# Patient Record
Sex: Male | Born: 1945 | Race: White | Hispanic: No | Marital: Married | Smoking: Never smoker
Health system: Southern US, Community
[De-identification: ages and names within clinical notes are randomized; demographics above are authoritative.]

## PROBLEM LIST (undated history)

## (undated) DIAGNOSIS — F32A Depression, unspecified: Secondary | ICD-10-CM

## (undated) DIAGNOSIS — K760 Fatty (change of) liver, not elsewhere classified: Secondary | ICD-10-CM

## (undated) DIAGNOSIS — K219 Gastro-esophageal reflux disease without esophagitis: Secondary | ICD-10-CM

## (undated) DIAGNOSIS — I700 Atherosclerosis of aorta: Secondary | ICD-10-CM

## (undated) DIAGNOSIS — G4730 Sleep apnea, unspecified: Secondary | ICD-10-CM

## (undated) DIAGNOSIS — N400 Enlarged prostate without lower urinary tract symptoms: Secondary | ICD-10-CM

## (undated) DIAGNOSIS — M1990 Unspecified osteoarthritis, unspecified site: Secondary | ICD-10-CM

## (undated) DIAGNOSIS — E039 Hypothyroidism, unspecified: Secondary | ICD-10-CM

## (undated) DIAGNOSIS — Z8739 Personal history of other diseases of the musculoskeletal system and connective tissue: Secondary | ICD-10-CM

## (undated) DIAGNOSIS — B029 Zoster without complications: Secondary | ICD-10-CM

## (undated) DIAGNOSIS — I10 Essential (primary) hypertension: Secondary | ICD-10-CM

## (undated) DIAGNOSIS — H9190 Unspecified hearing loss, unspecified ear: Secondary | ICD-10-CM

## (undated) DIAGNOSIS — J45909 Unspecified asthma, uncomplicated: Secondary | ICD-10-CM

## (undated) HISTORY — PX: PALATE SURGERY: SHX729

## (undated) HISTORY — DX: Unspecified hearing loss, unspecified ear: H91.90

## (undated) HISTORY — DX: Benign prostatic hyperplasia without lower urinary tract symptoms: N40.0

## (undated) HISTORY — PX: NECK SURGERY: SHX720

## (undated) HISTORY — DX: Zoster without complications: B02.9

## (undated) HISTORY — PX: BACK SURGERY: SHX140

---

## 1998-05-28 ENCOUNTER — Ambulatory Visit (HOSPITAL_COMMUNITY): Admission: RE | Admit: 1998-05-28 | Discharge: 1998-05-28 | Payer: Self-pay | Admitting: *Deleted

## 2006-03-04 ENCOUNTER — Ambulatory Visit: Payer: Self-pay | Admitting: Otolaryngology

## 2006-03-14 ENCOUNTER — Ambulatory Visit: Payer: Self-pay | Admitting: Gastroenterology

## 2007-06-02 ENCOUNTER — Inpatient Hospital Stay (HOSPITAL_COMMUNITY): Admission: RE | Admit: 2007-06-02 | Discharge: 2007-06-04 | Payer: Self-pay | Admitting: Neurosurgery

## 2008-04-16 ENCOUNTER — Ambulatory Visit: Payer: Self-pay | Admitting: Gastroenterology

## 2011-01-12 NOTE — H&P (Signed)
NAMETAYON, PAREKH NO.:  [id number]   MEDICAL RECORD NO.:  [id number]          PATIENT TYPE:  OIB   LOCATION:  3037                         FACILITY:  [facility]   PHYSICIAN:  Payton Doughty, M.D.      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   SERVICE:  Neurosurgery   This is a very nice 65 year old right-handed white gentleman has had two  lumbar fusions done in [facility], has not gotten any better.  He came  to my office with questions about a spinal cord stimulator.  On exam he  was somewhat myelopathic so I obtained an MRI of the cervical spine,  found he has got cervical spondylosis with cord compression at 5-6 and 6-  7, and he is admitted now for an anterior decompression and fusion at  those levels.   MEDICAL HISTORY:  He takes Zoloft, Synthroid, a blood pressure medicine  and Neurontin.  He has no allergies.   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   SOCIAL HISTORY:  He does not smoke and drink and retired a bit early  because of back and leg pain.   FAMILY HISTORY:  Both parents are deceased.  Mother had back surgery and  respiratory difficulties and sleep apnea.   REVIEW OF SYSTEMS:  Remarkable for glasses, tinnitus, hypertension, leg  pain, shortness of breath, back pain, neck pain and thyroid disease.   HEENT EXAM:  Within normal limits.  He has some neck pain but good range  of motion.  CHEST:  Clear.  CARDIAC EXAM:  Regular rate and rhythm.  ABDOMEN:  Nontender with no hepatosplenomegaly.  EXTREMITIES:  Without clubbing or cyanosis.  GU EXAM:  Deferred.  PERIPHERAL PULSES:  Good.  NEUROLOGICAL:  He is awake, alert and oriented.  His cranial nerves are  intact.  He has a mild Lhermitte's sign.  Motor exam shows 5/5  strength  throughout the upper and lower extremities.  He does have pain and  limited weakness in the dorsiflexors on the right.  Sensory dysesthesias  described in the right L5 distribution.  Reflexes are 3 at the biceps, 3  at the triceps, 1 at the brachioradialis.  Hoffman's is mildly positive  on the left.  Lower extremity knee jerks are 2.  Ankle jerk is absent on  the right, left is 1.  He has a slightly positive straight leg raise on  the right.   MR shows cervical spondylosis with bilateral foraminal narrowing.  There  is mild cord compression but no abnormal signal.   CLINICAL IMPRESSION:  Cervical spondylosis with early myelopathy.   The plan is for an anterior decompression and fusion at C5-6, C6-7.  The  risks and benefits have been discussed with him and he wishes to  proceed.           ______________________________  Emilie Rutter.  Channing Mutters, M.D.     MWR/MEDQ  D:  06/02/2007  T:  06/03/2007  Job:  [id number]

## 2011-01-12 NOTE — Op Note (Signed)
Bradley Holland, Bradley Holland               ACCOUNT NO.:  [id number]   MEDICAL RECORD NO.:  [id number]          PATIENT TYPE:  AMB   LOCATION:  SDS                          FACILITY:  [facility]   PHYSICIAN:  Payton Doughty, M.D.      DATE OF BIRTH:  1946/03/01   DATE OF PROCEDURE:  06/02/2007  DATE OF DISCHARGE:                               OPERATIVE REPORT   PREOPERATIVE DIAGNOSIS:  Spondylosis C5-C6 and C6-C7.   POSTOPERATIVE DIAGNOSIS:  Spondylosis C5-C6 and C6-C7.   OPERATIVE PROCEDURE:  C5-C6 and C6-C7 anterior cervical decompression  and fusion with Reflex hybrid plate.   SURGEON:  Payton Doughty, M.D.   ASSISTANT:  Coletta Memos, M.D.  [place]   ANESTHESIA:  General endotracheal anesthesia.   PREPARATION:  Betadine prep with alcohol wipe.   COMPLICATIONS:  None.   BODY OF TEXT:  This 64 year old gentleman with cervical spondylitic  myelopathy is taken to the operating room, smoothly anesthetized, and  intubated, placed supine on the operating table in the halter head  traction.  Following shave, prep, and drape in the usual sterile  fashion, the skin was incised in the midline of the medial border of the  sternocleidomastoid muscle two fingerbreadths below the level of the  carotid tubercle.  The sternocleidomastoid was identified, medial  dissection revealed the carotid artery retracted laterally to the left,  the trachea and esophagus retracted laterally to right, exposing the  bones of the anterior cervical spine.  A marker was placed.  Intraoperative x-ray obtained to confirm correctness of the level.  Having confirmed correctness of the level, the longus colli was taken  down bilaterally.  The Shadowline self-retaining retractor was placed.  Under gross observation, discectomy was carried out at C5-C6 and C6-C7.  The operating microscope was then brought in.  We used microdissection  technique to remove the remaining posterior osteophytes, dissect the  neural foramen, and  divide the posterior longitudinal ligament.  There  was significant spondylitic disease at both levels, a larger midline bar  at C5-C6, and more foraminal disease at C6-C7.  Following complete  decompression, 7 mm bone grafts were fashioned with patellar allograft  and tapped into place.  A 40 mm Reflex hybrid plate was placed with 12  mm screws, two in C5, two in C6, and two in C7.  Intraoperative x-ray  confirmed good placement of bone graft, plate and screws.  The  wound was irrigated.  Hemostasis was assured.  Successive layers of 3-0  Vicryl and 4-0 Vicryl were used to close.  Benzoin and Steri-Strips were  placed made occlusive with Telfa and OpSite.  The patient was placed in  an Aspen collar and returned to the recovery room in good condition.           ______________________________  Payton Doughty, M.D.     MWR/MEDQ  D:  06/02/2007  T:  06/03/2007  Job:  [id number]

## 2011-06-10 LAB — URINALYSIS, ROUTINE W REFLEX MICROSCOPIC
Bilirubin Urine: NEGATIVE
Ketones, ur: NEGATIVE
Nitrite: NEGATIVE
Specific Gravity, Urine: 1.03
Urobilinogen, UA: 0.2
pH: 5.5

## 2011-06-10 LAB — COMPREHENSIVE METABOLIC PANEL
Albumin: 4.2
BUN: 17
Creatinine, Ser: 1.09
Glucose, Bld: 108 — ABNORMAL HIGH
Total Protein: 6.7

## 2011-06-10 LAB — CBC
HCT: 44.5
Hemoglobin: 15.6
MCHC: 35
MCV: 88.5
Platelets: 234
RBC: 5.03
RDW: 13.4
WBC: 5

## 2011-06-10 LAB — DIFFERENTIAL
Basophils Absolute: 0
Lymphocytes Relative: 30
Monocytes Absolute: 0.4
Monocytes Relative: 8
Neutro Abs: 3
Neutrophils Relative %: 60

## 2011-06-10 LAB — PROTIME-INR: INR: 0.9

## 2011-06-10 LAB — TYPE AND SCREEN

## 2011-06-10 LAB — APTT: aPTT: 26

## 2011-08-31 DIAGNOSIS — B029 Zoster without complications: Secondary | ICD-10-CM

## 2011-08-31 HISTORY — DX: Zoster without complications: B02.9

## 2011-09-07 ENCOUNTER — Ambulatory Visit: Payer: Self-pay | Admitting: Family Medicine

## 2011-09-14 ENCOUNTER — Ambulatory Visit: Payer: Self-pay | Admitting: Gastroenterology

## 2011-09-14 HISTORY — PX: COLONOSCOPY: SHX174

## 2011-09-21 ENCOUNTER — Ambulatory Visit: Payer: Self-pay | Admitting: Family Medicine

## 2013-07-17 ENCOUNTER — Ambulatory Visit: Payer: Self-pay | Admitting: Otolaryngology

## 2013-07-18 ENCOUNTER — Ambulatory Visit: Payer: Self-pay | Admitting: Otolaryngology

## 2014-02-12 ENCOUNTER — Encounter: Payer: Self-pay | Admitting: General Surgery

## 2014-02-12 ENCOUNTER — Ambulatory Visit (INDEPENDENT_AMBULATORY_CARE_PROVIDER_SITE_OTHER): Payer: Medicare PPO | Admitting: General Surgery

## 2014-02-12 VITALS — BP 114/64 | HR 70 | Temp 98.1°F | Resp 14 | Ht 68.0 in | Wt 195.0 lb

## 2014-02-12 DIAGNOSIS — R229 Localized swelling, mass and lump, unspecified: Secondary | ICD-10-CM

## 2014-02-12 DIAGNOSIS — B0229 Other postherpetic nervous system involvement: Secondary | ICD-10-CM

## 2014-02-12 DIAGNOSIS — R222 Localized swelling, mass and lump, trunk: Secondary | ICD-10-CM

## 2014-02-12 NOTE — Patient Instructions (Addendum)
The patient is aware to call back for any questions or concerns. Call back after one week with status report.

## 2014-02-12 NOTE — Progress Notes (Signed)
Patient ID: Bradley Holland, male   DOB: 04/12/1946, 68 y.o.   MRN: [id number]  Chief Complaint  Patient presents with  . Other    right back mass    HPI Bradley Holland is a 68 y.o. male here today for an evaluation mass on right back . Patient states the mass has been there since Jan 2013 after he had the shingles. It seems to be a constant irritation. Lifting makes it worse. Trying cymbalta, fentanyl patch and gabapentin. Followed by Dr. Manuella Ghazi as well for neuropathy.  The area is point tender with direct pressure, as when he would sit in a hardback chair.     HPI  Past Medical History  Diagnosis Date  . Hearing loss   . Prostate enlargement   . Shingles Jan 2013    No past surgical history on file.  No family history on file.  Social History History  Substance Use Topics  . Smoking status: Never Smoker   . Smokeless tobacco: Never Used  . Alcohol Use: No    Allergies  Allergen Reactions  . Ambien [Zolpidem Tartrate] Other (See Comments)    "crazy"  . Sulfa Antibiotics Rash    Current Outpatient Prescriptions  Medication Sig Dispense Refill  . aspirin 81 MG tablet Take 81 mg by mouth daily.      . B Complex Vitamins (B-COMPLEX/B-12 PO) Take 1 tablet by mouth daily.      . cyanocobalamin (,VITAMIN B-12,) 1000 MCG/ML injection Inject into the muscle.      . DULoxetine (CYMBALTA) 60 MG capsule Take 60 mg by mouth daily.      . fentaNYL (DURAGESIC - DOSED MCG/HR) 25 MCG/HR patch Place onto the skin.      Marland Kitchen gabapentin (NEURONTIN) 300 MG capsule Take 600 mg by mouth 2 (two) times daily.       Marland Kitchen levothyroxine (SYNTHROID, LEVOTHROID) 100 MCG tablet Take 100 mcg by mouth daily before breakfast.      . lovastatin (MEVACOR) 40 MG tablet Take 40 mg by mouth at bedtime.      . sertraline (ZOLOFT) 100 MG tablet Take 100 mg by mouth daily.      . traMADol (ULTRAM) 50 MG tablet Take 50 mg by mouth every 6 (six) hours as needed.      . triamterene-hydrochlorothiazide (MAXZIDE-25)  37.5-25 MG per tablet Take 1 tablet by mouth daily.       No current facility-administered medications for this visit.    Review of Systems Review of Systems  Constitutional: Positive for chills.  Respiratory: Positive for shortness of breath. Negative for apnea, cough, choking, chest tightness, wheezing and stridor.   Cardiovascular: Negative.     Blood pressure 114/64, pulse 70, temperature 98.1 F (36.7 C), temperature source Oral, resp. rate 14, height 5\' 8"  (1.727 m), weight 195 lb (88.451 kg).  Physical Exam Physical Exam  Constitutional: He is oriented to person, place, and time. He appears well-developed and well-nourished.  Neck: Neck supple.  Cardiovascular: Normal rate, regular rhythm and normal heart sounds.   Pulmonary/Chest: Effort normal and breath sounds normal.  Musculoskeletal: Normal range of motion.  Lymphadenopathy:    He has no cervical adenopathy.  Neurological: He is alert and oriented to person, place, and time.  Skin: Skin is warm and dry.  2.5 cm soft tissue nodule 6 cm from midline at T8 location    Data Reviewed PCP notes of 02/08/2014  Assessment    Right back lipoma.  Postherpetic  neuralgia.    Plan    It's possible that the lipoma is compressing a cutaneous nerve, aggravating his postherpetic neuralgia. Prior to excision I recommended a cortisone shot. 4 mg of dexamethasone mixed with 2 cc of 0.5% Xylocaine with 0.25% Marcaine with 1 200,000 units epinephrine was instilled. He was well tolerated. Post injection there was a significant decrease in trigger point discomfort.  We'll see his response to this over the next week and consider excision if he does not show persistent improvement in his pain.    Call back after one week with status up date.  PCP: Ocie Cornfield 02/13/2014, 1:15 PM

## 2014-02-13 DIAGNOSIS — B0229 Other postherpetic nervous system involvement: Secondary | ICD-10-CM | POA: Insufficient documentation

## 2014-02-13 DIAGNOSIS — R222 Localized swelling, mass and lump, trunk: Secondary | ICD-10-CM | POA: Insufficient documentation

## 2014-02-18 ENCOUNTER — Telehealth: Payer: Self-pay | Admitting: *Deleted

## 2014-02-18 NOTE — Telephone Encounter (Signed)
Pt called and was giving an update on how he was doing with the Cortisone shot that he had due to having a mass on his back and he thinks that it is not helping, that he is still in some pain with it.

## 2014-02-19 NOTE — Telephone Encounter (Signed)
Arrange f/u appt for excision of lipoma from the right back.

## 2014-03-06 ENCOUNTER — Encounter: Payer: Self-pay | Admitting: General Surgery

## 2014-03-06 ENCOUNTER — Ambulatory Visit (INDEPENDENT_AMBULATORY_CARE_PROVIDER_SITE_OTHER): Payer: Medicare PPO | Admitting: General Surgery

## 2014-03-06 VITALS — BP 128/76 | HR 71 | Resp 13 | Ht 68.0 in | Wt 190.0 lb

## 2014-03-06 DIAGNOSIS — R222 Localized swelling, mass and lump, trunk: Secondary | ICD-10-CM

## 2014-03-06 DIAGNOSIS — D235 Other benign neoplasm of skin of trunk: Secondary | ICD-10-CM

## 2014-03-06 NOTE — Progress Notes (Signed)
Patient ID: Bradley Holland, male   DOB: 04/19/1946, 68 y.o.   MRN: [id number]  Chief Complaint  Patient presents with  . Follow-up    lipoma on back    HPI Bradley Holland is a 68 y.o. male  Here following for excision of a nodular area on his back. He reports that the Cortisone injection he was given at his last visit resulted in transient improvement, but one of the local anesthetic wore off the pain was back to baseline. It is still very sensitive to touch and has not changed in size.  The area of point tenderness was identified by the patient and confirmed to show focal thickening in the area. HPI  Past Medical History  Diagnosis Date  . Hearing loss   . Prostate enlargement   . Shingles Jan 2013    No past surgical history on file.  No family history on file.  Social History History  Substance Use Topics  . Smoking status: Never Smoker   . Smokeless tobacco: Never Used  . Alcohol Use: No    Allergies  Allergen Reactions  . Ambien [Zolpidem Tartrate] Other (See Comments)    "crazy"  . Sulfa Antibiotics Rash    Current Outpatient Prescriptions  Medication Sig Dispense Refill  . aspirin 81 MG tablet Take 81 mg by mouth daily.      . B Complex Vitamins (B-COMPLEX/B-12 PO) Take 1 tablet by mouth daily.      . cyanocobalamin (,VITAMIN B-12,) 1000 MCG/ML injection Inject into the muscle.      . DULoxetine (CYMBALTA) 60 MG capsule Take 60 mg by mouth daily.      . fentaNYL (DURAGESIC - DOSED MCG/HR) 25 MCG/HR patch Place onto the skin.      Marland Kitchen gabapentin (NEURONTIN) 300 MG capsule Take 600 mg by mouth 2 (two) times daily.       Marland Kitchen levothyroxine (SYNTHROID, LEVOTHROID) 100 MCG tablet Take 100 mcg by mouth daily before breakfast.      . lovastatin (MEVACOR) 40 MG tablet Take 40 mg by mouth at bedtime.      . sertraline (ZOLOFT) 100 MG tablet Take 100 mg by mouth daily.      . traMADol (ULTRAM) 50 MG tablet Take 50 mg by mouth every 6 (six) hours as needed.      .  triamterene-hydrochlorothiazide (MAXZIDE-25) 37.5-25 MG per tablet Take 1 tablet by mouth daily.       No current facility-administered medications for this visit.    Review of Systems Review of Systems  Constitutional: Negative.   Respiratory: Negative.   Cardiovascular: Negative.     Blood pressure 128/76, pulse 71, resp. rate 13, height 5\' 8"  (1.727 m), weight 190 lb (86.183 kg).  Physical Exam Physical Exam  Constitutional: He is oriented to person, place, and time. He appears well-developed and well-nourished.  Pulmonary/Chest:    Neurological: He is alert and oriented to person, place, and time.       Assessment    Focal tenderness along the paraspinal soft tissue, transiently improved with local anesthesia injection.     Plan    We reviewed the potential for excisional biopsy in the hopes that this would improve his pain profile. It was emphasized that I could not guarantee that his pain would not persist after the mass was excised. He desired to proceed. The area was marked by the patient and focal point tenderness with nodularity confirmed on clinical exam. 10 cc of 0.5% Xylocaine  with 0.25% Marcaine with 1-200,000 of epinephrine was utilized well tolerated. Chlor prep was applied to the skin. The rate transverse incision the skin and subcutaneous tissue was incised sharply. Adipose tissue was excised down to the level of the fascia. No particular nodularity was appreciated. Hemostasis was with 3-0 Vicryl figure-of-eight sutures. The skin was closed with a running 3-0 Prolene subcuticular suture. Benzoin Steri-Strips were applied. Telfa and Tegaderm dressing applied. He was instructed in regards to wound care. He will follow up in one week for exam ablation with the nurse and in one month for physician exam to assess resolution of the symptoms.     PCP: Ocie Cornfield 03/06/2014, 8:55 PM

## 2014-03-06 NOTE — Patient Instructions (Signed)
1 week nure

## 2014-03-08 LAB — PATHOLOGY

## 2014-03-12 ENCOUNTER — Telehealth: Payer: Self-pay | Admitting: *Deleted

## 2014-03-12 NOTE — Telephone Encounter (Signed)
Notified patient as instructed, patient pleased. He states the area is bruised and he is still having pain. Discussed follow-up appointments, patient agrees.

## 2014-03-12 NOTE — Telephone Encounter (Signed)
Message copied by Carson Myrtle on Tue Mar 12, 2014  8:17 AM ------      Message from: [name]      Created: Sat Mar 09, 2014  9:34 AM       Please notify the patient at the tissue removed was benign. See if the chronic pain in the area is improved at all since it was excised. He is scheduled to followup on July 15 for a wound check. Thank you      ----- Message -----         From: [facility] Lab Results In Interface         Sent: 03/08/2014   4:39 PM           To: [name], MD                   ------

## 2014-03-13 ENCOUNTER — Ambulatory Visit (INDEPENDENT_AMBULATORY_CARE_PROVIDER_SITE_OTHER): Payer: Self-pay | Admitting: *Deleted

## 2014-03-13 DIAGNOSIS — R222 Localized swelling, mass and lump, trunk: Secondary | ICD-10-CM

## 2014-03-13 DIAGNOSIS — R229 Localized swelling, mass and lump, unspecified: Secondary | ICD-10-CM

## 2014-03-13 NOTE — Progress Notes (Signed)
Patient came in today for a wound check right back mass.  The wound is clean, with no signs of infection noted. Small bruising noted. Area still tender. Follow up as scheduled.

## 2014-03-13 NOTE — Patient Instructions (Signed)
Follow up as needed

## 2014-04-08 ENCOUNTER — Encounter: Payer: Self-pay | Admitting: General Surgery

## 2014-04-08 ENCOUNTER — Ambulatory Visit (INDEPENDENT_AMBULATORY_CARE_PROVIDER_SITE_OTHER): Payer: Medicare PPO | Admitting: General Surgery

## 2014-04-08 VITALS — BP 130/72 | HR 74 | Resp 12 | Ht 68.0 in | Wt 190.0 lb

## 2014-04-08 DIAGNOSIS — R229 Localized swelling, mass and lump, unspecified: Secondary | ICD-10-CM

## 2014-04-08 DIAGNOSIS — R222 Localized swelling, mass and lump, trunk: Secondary | ICD-10-CM

## 2014-04-08 NOTE — Progress Notes (Signed)
Patient ID: Bradley Holland, male   DOB: 05-03-1946, 68 y.o.   MRN: [id number]  Chief Complaint  Patient presents with  . Follow-up    excision right back mass    HPI Bradley Holland is a 68 y.o. male here today following up from excision right back mass on 02/12/14. Patient states the area  still very sensitive to touch and painful with movement. The patient originally was treated with a Xylocaine/Marcaine/dexamethasone injection. He had transient improvement but did not persist post 3 days injection.  The focal thickening was excised with fatty tissue suggestive of a lipoma. He continues to have pain at this site.  The patient was evaluated by Dr. Brigitte Pulse in the neurology Department at [facility].  During that visit the patient describes most of the attention being directed to his lower extremity neuropathy. He was started on Neurontin without significant improvement. He did not followup nor notify Dr. Brigitte Pulse of his progress.  HPI  Past Medical History  Diagnosis Date  . Hearing loss   . Prostate enlargement   . Shingles Jan 2013    No past surgical history on file.  No family history on file.  Social History History  Substance Use Topics  . Smoking status: Never Smoker   . Smokeless tobacco: Never Used  . Alcohol Use: No    Allergies  Allergen Reactions  . Ambien [Zolpidem Tartrate] Other (See Comments)    "crazy"  . Sulfa Antibiotics Rash    Current Outpatient Prescriptions  Medication Sig Dispense Refill  . aspirin 81 MG tablet Take 81 mg by mouth daily.      . B Complex Vitamins (B-COMPLEX/B-12 PO) Take 1 tablet by mouth daily.      . cyanocobalamin (,VITAMIN B-12,) 1000 MCG/ML injection Inject into the muscle.      . DULoxetine (CYMBALTA) 60 MG capsule Take 60 mg by mouth daily.      Marland Kitchen gabapentin (NEURONTIN) 300 MG capsule Take 600 mg by mouth 2 (two) times daily.       Marland Kitchen levothyroxine (SYNTHROID, LEVOTHROID) 100 MCG tablet Take 100 mcg by mouth daily before breakfast.       . lovastatin (MEVACOR) 40 MG tablet Take 40 mg by mouth at bedtime.      . sertraline (ZOLOFT) 100 MG tablet Take 100 mg by mouth daily.      . traMADol (ULTRAM) 50 MG tablet Take 50 mg by mouth every 6 (six) hours as needed.      . triamterene-hydrochlorothiazide (MAXZIDE-25) 37.5-25 MG per tablet Take 1 tablet by mouth daily.       No current facility-administered medications for this visit.    Review of Systems Review of Systems  Blood pressure 130/72, pulse 74, resp. rate 12, height 5\' 8"  (1.727 m), weight 190 lb (86.183 kg).  Physical Exam Physical Exam  Skin:  Right back incision looks clean and well healed.   Scarring from his 2013 episode of shingles is evident. Tenderness along the excision site is appreciated. The area of shingles involved the T6-8 dermatome on the right.   Assessment    The patient has not had improvement in his focal pain/trigger point with excision of the adipose tissue/lipoma of the right back.     Plan    The patient was encouraged to followup with neurology for reassessment, as without followup they will not know that the Neurontin has not helped either his lower extremity neuropathy nor the back discomfort post shingles.  I offered  to arrange for an MRI of the back look for a mechanical reason for his pain, but he reported that he would followup with Dr. Brigitte Pulse first.     PCP: Arther Dames, Wendelyn Breslow    [name] 04/09/2014, 7:43 PM

## 2014-04-08 NOTE — Patient Instructions (Addendum)
Patient to return as needed. 

## 2014-04-29 ENCOUNTER — Telehealth: Payer: Self-pay

## 2014-04-29 NOTE — Telephone Encounter (Signed)
Spoke with patient about MRI of back. Patient is scheduled for a Thoracic MRI without contrast at [facility] on 05/01/14 at 7:30 am. He is to arrive by 7:00 am, no preparation is needed. Patient is aware of date, time, and instructions.

## 2014-04-29 NOTE — Telephone Encounter (Signed)
Message copied by Lesly Rubenstein on Mon Apr 29, 2014  8:42 AM ------      Message from: [place], Forest Gleason      Created: Sun Apr 28, 2014 10:09 AM      Regarding: RE: MRI       Thoracic spin. W/o contrast. Thanks.      ----- Message -----         From: Lesly Rubenstein, LPN         Sent: 9/82/6415  12:01 PM           To: [name], MD      Subject: MRI                                                      Patient called and said that he would like to go ahead with a MRI of his back. He said that he has been to see Dr Brigitte Pulse his neurologist and that he is in agreement for this. Just let me know what you would like.       ------

## 2014-05-01 ENCOUNTER — Ambulatory Visit: Payer: Self-pay | Admitting: General Surgery

## 2014-05-01 ENCOUNTER — Encounter: Payer: Self-pay | Admitting: General Surgery

## 2014-05-02 ENCOUNTER — Telehealth: Payer: Self-pay

## 2014-05-02 NOTE — Telephone Encounter (Signed)
Message copied by Lesly Rubenstein on Thu May 02, 2014  2:25 PM ------      Message from: [place], Atkinson W      Created: Wed May 01, 2014  9:58 AM       Notify the patient that the thoracic spine MRI did not show any major disc disease.  Forward copy to his neurologist if he desires. Nothing new to add re: right mid back pain management. Thanks.       ----- Message -----         From: Darrin Nipper, CMA         Sent: 05/01/2014   9:45 AM           To: [name], MD                   ------

## 2014-05-02 NOTE — Telephone Encounter (Signed)
Notified patient as instructed, patient will be going to see Dr Carloyn Manner and will pick up a disk of his MRI at [facility] for this visit.

## 2014-09-17 ENCOUNTER — Ambulatory Visit: Payer: Self-pay | Admitting: Pain Medicine

## 2014-10-02 ENCOUNTER — Ambulatory Visit: Payer: Self-pay | Admitting: Pain Medicine

## 2014-10-31 ENCOUNTER — Ambulatory Visit: Payer: Self-pay | Admitting: Pain Medicine

## 2014-11-18 ENCOUNTER — Ambulatory Visit: Payer: Self-pay | Admitting: Pain Medicine

## 2015-01-21 ENCOUNTER — Ambulatory Visit: Payer: Self-pay | Admitting: Pain Medicine

## 2015-02-10 ENCOUNTER — Other Ambulatory Visit: Payer: Self-pay | Admitting: Internal Medicine

## 2015-02-10 DIAGNOSIS — I10 Essential (primary) hypertension: Secondary | ICD-10-CM

## 2015-02-13 ENCOUNTER — Ambulatory Visit
Admission: RE | Admit: 2015-02-13 | Discharge: 2015-02-13 | Disposition: A | Discharge status: Home / Self Care | Payer: Medicare PPO | Source: Ambulatory Visit | Attending: Internal Medicine | Admitting: Internal Medicine

## 2015-02-13 DIAGNOSIS — I700 Atherosclerosis of aorta: Secondary | ICD-10-CM | POA: Diagnosis present

## 2015-02-13 DIAGNOSIS — I10 Essential (primary) hypertension: Secondary | ICD-10-CM

## 2015-04-28 ENCOUNTER — Encounter: Payer: Self-pay | Admitting: General Surgery

## 2015-04-28 ENCOUNTER — Ambulatory Visit (INDEPENDENT_AMBULATORY_CARE_PROVIDER_SITE_OTHER): Payer: Medicare PPO | Admitting: General Surgery

## 2015-04-28 VITALS — BP 142/88 | HR 84 | Resp 16 | Ht 68.0 in | Wt 199.4 lb

## 2015-04-28 DIAGNOSIS — Z860100 Personal history of colon polyps, unspecified: Secondary | ICD-10-CM | POA: Insufficient documentation

## 2015-04-28 NOTE — Progress Notes (Signed)
Patient ID: [facility], male   DOB: July 15, 1946, 69 y.o.   MRN: [id number]  Chief Complaint  Patient presents with  . Genetic Evaluation    Discuss a colonoscopy    HPI [facility] is a 69 y.o. male here today to discuss having a Colonoscopy. He had one done last 3 years ago at [facility] on 09/14/2011 with dr. Gustavo [facility]. He has a long history of having Colon Polyps. He he reports an occasional spot of right red blood after bowel movements. No history of increase in the stools.  The patient is retired from [employer].  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  He reports intermittent shortness of breath with strenuous activity. This might be is minimal as carrying 3 bags of laundry/donated clothing to the truck from the house. No progression in symptoms over the last several years. No associated chest pain. HPI  Past Medical History  Diagnosis Date  . Hearing loss   . Prostate enlargement   . Shingles Jan 2013    Past Surgical History  Procedure Laterality Date  . Colonoscopy  09/14/2011    Dr. Gustavo [facility]    No family history on file.  Social History Social History  Substance Use Topics  . Smoking status: Never Smoker   . Smokeless tobacco: Never Used  . Alcohol Use: No    Allergies  Allergen Reactions  . Ambien [Zolpidem Tartrate] Other (See Comments)    "crazy"  . Simvastatin Other (See Comments)  . Sulfa Antibiotics Rash    Current Outpatient Prescriptions  Medication Sig Dispense Refill  . aspirin 81 MG tablet Take 81 mg by mouth daily.    . B Complex Vitamins (B-COMPLEX/B-12 PO) Take 1 tablet by mouth daily.    . DULoxetine (CYMBALTA) 60 MG capsule Take 60 mg by mouth daily.    Marland Kitchen gabapentin (NEURONTIN) 300 MG capsule Take 600 mg by mouth 2 (two) times daily.     Marland Kitchen levothyroxine (SYNTHROID, LEVOTHROID) 100 MCG tablet TAKE 1 TABLET IN THE MORNING    . lovastatin (MEVACOR) 40 MG tablet TAKE 1 TABLET EVERY DAY    . rOPINIRole (REQUIP) 0.5 MG tablet Take by mouth.     . sertraline (ZOLOFT) 100 MG tablet TAKE 1 TABLET EVERY DAY    . traMADol (ULTRAM) 50 MG tablet Take by mouth.    . traZODone (DESYREL) 50 MG tablet TAKE 1 TABLET AT BEDTIME    . triamterene-hydrochlorothiazide (MAXZIDE-25) 37.5-25 MG per tablet Take 1 tablet by mouth daily.    Marland Kitchen ALPRAZolam (XANAX) 0.25 MG tablet TAKE 1 TABLET TWICE A DAY AS NEEDED FOR SLEEP  5  . tamsulosin (FLOMAX) 0.4 MG CAPS capsule TAKE 1 CAPSULE (0.4 MG TOTAL) BY MOUTH ONCE DAILY. TAKE 30 MINUTES AFTER SAME MEAL EACH DAY.  11   No current facility-administered medications for this visit.    Review of Systems Review of Systems  Constitutional: Negative.   Respiratory: Negative.   Cardiovascular: Negative.   Gastrointestinal: Negative.     Blood pressure 142/88, pulse 84, resp. rate 16, height 5\' 8"  (1.727 m), weight 199 lb 6.4 oz (90.447 kg).  Physical Exam Physical Exam  Constitutional: He is oriented to person, place, and time. He appears well-developed and well-nourished.  Eyes: Conjunctivae are normal. No scleral icterus.  Neck: Normal range of motion. Neck supple.  Cardiovascular: Normal rate, regular rhythm and normal heart sounds.   Pulmonary/Chest: Effort normal and breath sounds normal.  Neurological: He is alert and  oriented to person, place, and time. He has normal reflexes.  Skin: Skin is warm and dry.  Psychiatric: He has a normal mood and affect.    Data Reviewed 2004-2007, 2009 colonoscopy reports reviewed. Pathology on the 2007 exam showed a tubulovillous polyp in the proximal ascending colon as well as the mid ascending colon and a tubular adenoma in the ascending colon.  The 2009 exam showed tubular adenomas 3 in the ascending colon and 1 on the proximal transverse colon. 1 in the mid transverse colon. Hyperplastic polyp in the rectal vault.  The patient reports that 2012 exam did not show any polyps.  Pathology on the 2004 exam a which time a 7 mm polyp was removed from the  transverse colon any presented with rectal bleeding is not in the [facility] computer system.  Assessment    Multiple right-sided colonic polyps. Now at 9 adenomatous polyps lifetime.  Intermittent shortness of breath, reported normal breathing test in the past. No other cardiac symptoms, nonsmoker.    Plan    Patient will be contacted when November 2016 schedule is available. Miralax prescription will be sent in once date arranged. It is okay for patient to continue 81 mg aspirin.  The patient may be a candidate for screening for Lynch syndrome if additional polyps are identified.    PCP: Dr. Karen [facility]   [facility] 04/28/2015, 12:32 PM

## 2015-04-28 NOTE — Patient Instructions (Addendum)
Colonoscopy A colonoscopy is an exam to look at the entire large intestine (colon). This exam can help find problems such as tumors, polyps, inflammation, and areas of bleeding. The exam takes about 1 hour.  LET [facility] CARE PROVIDER KNOW ABOUT:   Any allergies you have.  All medicines you are taking, including vitamins, herbs, eye drops, creams, and over-the-counter medicines.  Previous problems you or members of your family have had with the use of anesthetics.  Any blood disorders you have.  Previous surgeries you have had.  Medical conditions you have. RISKS AND COMPLICATIONS  Generally, this is a safe procedure. However, as with any procedure, complications can occur. Possible complications include:  Bleeding.  Tearing or rupture of the colon wall.  Reaction to medicines given during the exam.  Infection (rare). BEFORE THE PROCEDURE   Ask your health care provider about changing or stopping your regular medicines.  You may be prescribed an oral bowel prep. This involves drinking a large amount of medicated liquid, starting the day before your procedure. The liquid will cause you to have multiple loose stools until your stool is almost clear or light green. This cleans out your colon in preparation for the procedure.  Do not eat or drink anything else once you have started the bowel prep, unless your health care provider tells you it is safe to do so.  Arrange for someone to drive you home after the procedure. PROCEDURE   You will be given medicine to help you relax (sedative).  You will lie on your side with your knees bent.  A long, flexible tube with a light and camera on the end (colonoscope) will be inserted through the rectum and into the colon. The camera sends video back to a computer screen as it moves through the colon. The colonoscope also releases carbon dioxide gas to inflate the colon. This helps your health care provider see the area better.  During  the exam, your health care provider may take a small tissue sample (biopsy) to be examined under a microscope if any abnormalities are found.  The exam is finished when the entire colon has been viewed. AFTER THE PROCEDURE   Do not drive for 24 hours after the exam.  You may have a small amount of blood in your stool.  You may pass moderate amounts of gas and have mild abdominal cramping or bloating. This is caused by the gas used to inflate your colon during the exam.  Ask when your test results will be ready and how you will get your results. Make sure you get your test results. Document Released: 08/13/2000 Document Revised: 06/06/2013 Document Reviewed: 04/23/2013 [facility] Patient Information 2015 [place], [place]. This information is not intended to replace advice given to you by your health care provider. Make sure you discuss any questions you have with your health care provider.  Patient will be contacted when November 2016 schedule is available. Miralax prescription will be sent in once date arranged. It is okay for patient to continue 81 mg aspirin.

## 2015-05-08 ENCOUNTER — Telehealth: Payer: Self-pay | Admitting: *Deleted

## 2015-05-08 MED ORDER — POLYETHYLENE GLYCOL 3350 17 GM/SCOOP PO POWD: ORAL | Status: DC

## 2015-05-08 NOTE — Telephone Encounter (Signed)
Patient has been scheduled for a colonoscopy on 07-09-15 at [facility].   Miralax prescription sent in to patient's pharmacy today.

## 2015-06-20 ENCOUNTER — Other Ambulatory Visit: Payer: Self-pay | Admitting: Student

## 2015-06-20 ENCOUNTER — Other Ambulatory Visit: Payer: Self-pay | Admitting: Internal Medicine

## 2015-06-20 DIAGNOSIS — R222 Localized swelling, mass and lump, trunk: Secondary | ICD-10-CM

## 2015-06-24 ENCOUNTER — Ambulatory Visit
Admission: RE | Admit: 2015-06-24 | Discharge: 2015-06-24 | Disposition: A | Discharge status: Home / Self Care | Payer: Medicare PPO | Source: Ambulatory Visit | Attending: Internal Medicine | Admitting: Internal Medicine

## 2015-06-24 DIAGNOSIS — R222 Localized swelling, mass and lump, trunk: Secondary | ICD-10-CM | POA: Diagnosis present

## 2015-06-24 DIAGNOSIS — N62 Hypertrophy of breast: Secondary | ICD-10-CM | POA: Insufficient documentation

## 2015-06-24 HISTORY — DX: Essential (primary) hypertension: I10

## 2015-06-24 HISTORY — DX: Unspecified asthma, uncomplicated: J45.909

## 2015-06-24 MED ORDER — IOHEXOL 300 MG/ML  SOLN
75.0000 mL | Freq: Once | INTRAMUSCULAR | Status: AC | PRN
  Administered 2015-06-24: 75 mL via INTRAVENOUS

## 2015-07-02 ENCOUNTER — Telehealth: Payer: Self-pay | Admitting: *Deleted

## 2015-07-02 NOTE — Telephone Encounter (Signed)
Patient was contacted today and confirms no medication changes since last office visit.   This patient also reports he has yet to pick up Miralax prescription but was reminded to do so.   We will proceed with colonoscopy as scheduled for 07-09-15 at [facility].   Patient instructed to call the office if he has further questions.

## 2015-07-07 ENCOUNTER — Other Ambulatory Visit: Payer: Self-pay | Admitting: General Surgery

## 2015-07-07 DIAGNOSIS — Z86010 Personal history of colonic polyps: Secondary | ICD-10-CM

## 2015-07-08 ENCOUNTER — Encounter: Payer: Self-pay | Admitting: *Deleted

## 2015-07-09 ENCOUNTER — Ambulatory Visit
Admission: RE | Admit: 2015-07-09 | Discharge: 2015-07-09 | Disposition: A | Discharge status: Home / Self Care | Payer: Medicare PPO | Source: Ambulatory Visit | Attending: General Surgery | Admitting: General Surgery

## 2015-07-09 ENCOUNTER — Ambulatory Visit: Payer: Medicare PPO | Admitting: Anesthesiology

## 2015-07-09 ENCOUNTER — Encounter: Payer: Self-pay | Admitting: *Deleted

## 2015-07-09 ENCOUNTER — Encounter
Admission: RE | Disposition: A | Discharge status: Home / Self Care | Payer: Self-pay | Source: Ambulatory Visit | Attending: General Surgery

## 2015-07-09 DIAGNOSIS — I10 Essential (primary) hypertension: Secondary | ICD-10-CM | POA: Insufficient documentation

## 2015-07-09 DIAGNOSIS — J45909 Unspecified asthma, uncomplicated: Secondary | ICD-10-CM | POA: Insufficient documentation

## 2015-07-09 DIAGNOSIS — Z7982 Long term (current) use of aspirin: Secondary | ICD-10-CM | POA: Insufficient documentation

## 2015-07-09 DIAGNOSIS — K5730 Diverticulosis of large intestine without perforation or abscess without bleeding: Secondary | ICD-10-CM | POA: Diagnosis not present

## 2015-07-09 DIAGNOSIS — Z888 Allergy status to other drugs, medicaments and biological substances status: Secondary | ICD-10-CM | POA: Insufficient documentation

## 2015-07-09 DIAGNOSIS — N400 Enlarged prostate without lower urinary tract symptoms: Secondary | ICD-10-CM | POA: Diagnosis not present

## 2015-07-09 DIAGNOSIS — G4730 Sleep apnea, unspecified: Secondary | ICD-10-CM | POA: Diagnosis not present

## 2015-07-09 DIAGNOSIS — Z86010 Personal history of colonic polyps: Secondary | ICD-10-CM | POA: Diagnosis not present

## 2015-07-09 DIAGNOSIS — Z9889 Other specified postprocedural states: Secondary | ICD-10-CM | POA: Diagnosis not present

## 2015-07-09 DIAGNOSIS — E039 Hypothyroidism, unspecified: Secondary | ICD-10-CM | POA: Diagnosis not present

## 2015-07-09 DIAGNOSIS — Z79899 Other long term (current) drug therapy: Secondary | ICD-10-CM | POA: Diagnosis not present

## 2015-07-09 DIAGNOSIS — Z882 Allergy status to sulfonamides status: Secondary | ICD-10-CM | POA: Insufficient documentation

## 2015-07-09 HISTORY — PX: COLONOSCOPY WITH PROPOFOL: SHX5780

## 2015-07-09 HISTORY — DX: Unspecified osteoarthritis, unspecified site: M19.90

## 2015-07-09 HISTORY — DX: Sleep apnea, unspecified: G47.30

## 2015-07-09 HISTORY — DX: Hypothyroidism, unspecified: E03.9

## 2015-07-09 SURGERY — COLONOSCOPY WITH PROPOFOL
Anesthesia: General

## 2015-07-09 MED ORDER — SODIUM CHLORIDE 0.9 % IV SOLN
INTRAVENOUS | Status: DC
  Administered 2015-07-09: 1000 mL via INTRAVENOUS

## 2015-07-09 MED ORDER — MIDAZOLAM HCL 2 MG/2ML IJ SOLN
INTRAMUSCULAR | Status: DC | PRN
  Administered 2015-07-09: 2 mg via INTRAVENOUS

## 2015-07-09 MED ORDER — LIDOCAINE HCL (CARDIAC) 20 MG/ML IV SOLN
INTRAVENOUS | Status: DC | PRN
  Administered 2015-07-09: 80 mg via INTRAVENOUS

## 2015-07-09 MED ORDER — PROPOFOL 500 MG/50ML IV EMUL
INTRAVENOUS | Status: DC | PRN
Start: 2015-07-09 — End: 2015-07-09
  Administered 2015-07-09: 140 ug/kg/min via INTRAVENOUS

## 2015-07-09 NOTE — Op Note (Signed)
[facility] Patient Name: [facility] Procedure Date: 07/09/2015 8:58 AM MRN: [id number] Account #: [id number] Date of Birth: 1945/11/08 Admit Type: Outpatient Age: 69 Room: [facility] ENDO ROOM 1 Gender: Male Note Status: Finalized Procedure:         Colonoscopy Indications:       High risk colon cancer surveillance: Personal history of                     colonic polyps Providers:         [name], MD Referring MD:      Ramonita Lab, MD (Referring MD) Medicines:         Monitored Anesthesia Care Complications:     No immediate complications. Procedure:         Pre-Anesthesia Assessment:                    - Prior to the procedure, a History and Physical was                     performed, and patient medications, allergies and                     sensitivities were reviewed. The patient's tolerance of                     previous anesthesia was reviewed.                    - The risks and benefits of the procedure and the sedation                     options and risks were discussed with the patient. All                     questions were answered and informed consent was obtained.                    After obtaining informed consent, the colonoscope was                     passed under direct vision. Throughout the procedure, the                     patient's blood pressure, pulse, and oxygen saturations                     were monitored continuously. The Olympus PCF-H180AL                     colonoscope ( S#: [id number] ) was introduced through the                     anus and advanced to the the cecum, identified by                     appendiceal orifice and ileocecal valve. The colonoscopy                     was performed without difficulty. The patient tolerated                     the procedure well. The quality of the bowel preparation  was excellent. Findings:      A few medium-mouthed diverticula were found in  the sigmoid colon.      The retroflexed view of the distal rectum and anal verge was normal and       showed no anal or rectal abnormalities. Impression:        - Diverticulosis in the sigmoid colon.                    - The distal rectum and anal verge are normal on                     retroflexion view.                    - No specimens collected. Recommendation:    - Repeat colonoscopy in 5 years for surveillance. Diagnosis Code(s): --- Professional ---                    Z86.010, Personal history of colonic polyps                    K57.30, Diverticulosis of large intestine without                     perforation or abscess without bleeding [name], MD 07/09/2015 9:15:04 AM This report has been signed electronically. Number of Addenda: 0 Note Initiated On: 07/09/2015 8:58 AM Scope Withdrawal Time: 0 hours 6 minutes 24 seconds  Total Procedure Duration: 0 hours 10 minutes 46 seconds       [facility]

## 2015-07-09 NOTE — H&P (Signed)
DESSIE DELCARLO [id number] [birth year]     HPI: For follow up colonoscopy.   Prescriptions prior to admission  Medication Sig Dispense Refill Last Dose  . triamterene-hydrochlorothiazide (MAXZIDE-25) 37.5-25 MG per tablet Take 1 tablet by mouth daily.   07/09/2015 at 0700  . ALPRAZolam (XANAX) 0.25 MG tablet TAKE 1 TABLET TWICE A DAY AS NEEDED FOR SLEEP  5   . aspirin 81 MG tablet Take 81 mg by mouth daily.   Taking  . B Complex Vitamins (B-COMPLEX/B-12 PO) Take 1 tablet by mouth daily.   Taking  . DULoxetine (CYMBALTA) 60 MG capsule Take 60 mg by mouth daily.   Not Taking at Unknown time  . gabapentin (NEURONTIN) 300 MG capsule Take 600 mg by mouth 2 (two) times daily.    Not Taking at Unknown time  . levothyroxine (SYNTHROID, LEVOTHROID) 100 MCG tablet TAKE 1 TABLET IN THE MORNING   Taking  . lovastatin (MEVACOR) 40 MG tablet TAKE 1 TABLET EVERY DAY   Taking  . polyethylene glycol powder (GLYCOLAX/MIRALAX) powder 255 grams one bottle for colonoscopy prep 255 g 0   . rOPINIRole (REQUIP) 0.5 MG tablet Take by mouth.   Taking  . sertraline (ZOLOFT) 100 MG tablet TAKE 1 TABLET EVERY DAY   Taking  . tamsulosin (FLOMAX) 0.4 MG CAPS capsule TAKE 1 CAPSULE (0.4 MG TOTAL) BY MOUTH ONCE DAILY. TAKE 30 MINUTES AFTER SAME MEAL EACH DAY.  11   . traMADol (ULTRAM) 50 MG tablet Take by mouth.   Taking  . traZODone (DESYREL) 50 MG tablet TAKE 1 TABLET AT BEDTIME   Taking   Allergies  Allergen Reactions  . Ambien [Zolpidem Tartrate] Other (See Comments)    "crazy"  . Simvastatin Other (See Comments)  . Sulfa Antibiotics Rash   Past Medical History  Diagnosis Date  . Hearing loss   . Prostate enlargement   . Shingles Jan 2013  . Asthma   . Hypertension   . Sleep apnea   . Hypothyroidism   . Arthritis    Past Surgical History  Procedure Laterality Date  . Colonoscopy  09/14/2011    Dr. Gustavo Lah  . Back surgery    . Neck surgery    . Palate surgery     Social History   Social History   . Marital Status: Married    Spouse Name: N/A  . Number of Children: N/A  . Years of Education: N/A   Occupational History  . Not on file.   Social History Main Topics  . Smoking status: Never Smoker   . Smokeless tobacco: Never Used  . Alcohol Use: No  . Drug Use: No  . Sexual Activity: Not on file   Other Topics Concern  . Not on file   Social History Narrative   Social History   Social History Narrative     ROS: Negative.     PE: HEENT: Negative. Lungs: Clear. Cardio: RR. [name] 07/09/2015   Assessment/Plan:  Proceed with planned endoscopy.

## 2015-07-09 NOTE — Anesthesia Preprocedure Evaluation (Signed)
Anesthesia Evaluation  Patient identified by MRN, date of birth, ID band Patient awake    Reviewed: Allergy & Precautions, NPO status   Airway Mallampati: II       Dental no notable dental hx.    Pulmonary asthma , sleep apnea ,     + decreased breath sounds      Cardiovascular hypertension, Pt. on medications Normal cardiovascular exam     Neuro/Psych    GI/Hepatic negative GI ROS, Neg liver ROS,   Endo/Other  Hypothyroidism   Renal/GU negative Renal ROS     Musculoskeletal   Abdominal Normal abdominal exam  (+)   Peds  Hematology   Anesthesia Other Findings   Reproductive/Obstetrics                             Anesthesia Physical Anesthesia Plan  ASA: II  Anesthesia Plan: General   Post-op Pain Management:    Induction: Intravenous  Airway Management Planned: Nasal Cannula  Additional Equipment:   Intra-op Plan:   Post-operative Plan:   Informed Consent: I have reviewed the patients History and Physical, chart, labs and discussed the procedure including the risks, benefits and alternatives for the proposed anesthesia with the patient or authorized representative who has indicated his/her understanding and acceptance.     Plan Discussed with: CRNA  Anesthesia Plan Comments:         Anesthesia Quick Evaluation

## 2015-07-09 NOTE — Transfer of Care (Signed)
Immediate Anesthesia Transfer of Care Note  Patient: [facility]  Procedure(s) Performed: Procedure(s): COLONOSCOPY WITH PROPOFOL (N/A)  Patient Location: PACU  Anesthesia Type:General  Level of Consciousness: sedated  Airway & Oxygen Therapy: Patient Spontanous Breathing and Patient connected to nasal cannula oxygen  Post-op Assessment: Report given to RN and Post -op Vital signs reviewed and stable  Post vital signs: Reviewed and stable  Last Vitals: 9:18  97.2 52hr 17 resp 89/64 95%sat  Filed Vitals:   07/09/15 0815  BP: 148/74  Pulse: 68  Temp: 36.4 C  Resp: 18    Complications: No apparent anesthesia complications

## 2015-07-09 NOTE — Anesthesia Postprocedure Evaluation (Signed)
  Anesthesia Post-op Note  Patient: [facility]  Procedure(s) Performed: Procedure(s): COLONOSCOPY WITH PROPOFOL (N/A)  Anesthesia type:General  Patient location: PACU  Post pain: Pain level controlled  Post assessment: Post-op Vital signs reviewed, Patient's Cardiovascular Status Stable, Respiratory Function Stable, Patent Airway and No signs of Nausea or vomiting  Post vital signs: Reviewed and stable  Last Vitals:  Filed Vitals:   07/09/15 0930  BP: 106/72  Pulse: 49  Temp:   Resp: 17    Level of consciousness: awake, alert  and patient cooperative  Complications: No apparent anesthesia complications

## 2015-07-14 ENCOUNTER — Ambulatory Visit (INDEPENDENT_AMBULATORY_CARE_PROVIDER_SITE_OTHER): Payer: Medicare PPO | Admitting: General Surgery

## 2015-07-14 ENCOUNTER — Encounter: Payer: Self-pay | Admitting: General Surgery

## 2015-07-14 VITALS — BP 140/76 | HR 76 | Resp 12 | Ht 68.0 in | Wt 194.0 lb

## 2015-07-14 DIAGNOSIS — M25512 Pain in left shoulder: Secondary | ICD-10-CM

## 2015-07-14 NOTE — Progress Notes (Signed)
Patient ID: [facility], male   DOB: 1946/02/01, 69 y.o.   MRN: [id number]  Chief Complaint  Patient presents with  . Other    gynecomastia    HPI [facility] is a 69 y.o. male here today for gynecomastia, seen in 2007. He states he noticed swelling in his left breast area. Patient states tenderness in his upper left chest near his clavicle  HPI  Past Medical History  Diagnosis Date  . Hearing loss   . Prostate enlargement   . Shingles Jan 2013  . Asthma   . Hypertension   . Sleep apnea   . Hypothyroidism   . Arthritis     Past Surgical History  Procedure Laterality Date  . Colonoscopy  09/14/2011    Dr. Gustavo Lah  . Back surgery    . Neck surgery    . Palate surgery    . Colonoscopy with propofol N/A 07/09/2015    Procedure: COLONOSCOPY WITH PROPOFOL;  Surgeon: [name], MD;  Location: [facility] ENDOSCOPY;  Service: Endoscopy;  Laterality: N/A;    No family history on file.  Social History Social History  Substance Use Topics  . Smoking status: Never Smoker   . Smokeless tobacco: Never Used  . Alcohol Use: No    Allergies  Allergen Reactions  . Ambien [Zolpidem Tartrate] Other (See Comments)    "crazy"  . Simvastatin Other (See Comments)  . Sulfa Antibiotics Rash    Current Outpatient Prescriptions  Medication Sig Dispense Refill  . ALPRAZolam (XANAX) 0.25 MG tablet TAKE 1 TABLET TWICE A DAY AS NEEDED FOR SLEEP  5  . aspirin 81 MG tablet Take 81 mg by mouth daily.    . B Complex Vitamins (B-COMPLEX/B-12 PO) Take 1 tablet by mouth daily.    . DULoxetine (CYMBALTA) 60 MG capsule Take 60 mg by mouth daily.    Marland Kitchen gabapentin (NEURONTIN) 300 MG capsule Take 600 mg by mouth 2 (two) times daily.     Marland Kitchen levothyroxine (SYNTHROID, LEVOTHROID) 100 MCG tablet TAKE 1 TABLET IN THE MORNING    . lovastatin (MEVACOR) 40 MG tablet TAKE 1 TABLET EVERY DAY    . polyethylene glycol powder (GLYCOLAX/MIRALAX) powder 255 grams one bottle for colonoscopy prep 255 g 0  .  rOPINIRole (REQUIP) 0.5 MG tablet Take by mouth.    . sertraline (ZOLOFT) 100 MG tablet TAKE 1 TABLET EVERY DAY    . tamsulosin (FLOMAX) 0.4 MG CAPS capsule TAKE 1 CAPSULE (0.4 MG TOTAL) BY MOUTH ONCE DAILY. TAKE 30 MINUTES AFTER SAME MEAL EACH DAY.  11  . traMADol (ULTRAM) 50 MG tablet Take by mouth.    . traZODone (DESYREL) 50 MG tablet TAKE 1 TABLET AT BEDTIME    . triamterene-hydrochlorothiazide (MAXZIDE-25) 37.5-25 MG per tablet Take 1 tablet by mouth daily.     No current facility-administered medications for this visit.    Review of Systems Review of Systems  Constitutional: Negative.   Respiratory: Negative.   Cardiovascular: Negative.     Blood pressure 140/76, pulse 76, resp. rate 12, height 5\' 8"  (1.727 m), weight 194 lb (87.998 kg).  Physical Exam Physical Exam  Constitutional: He is oriented to person, place, and time. He appears well-developed and well-nourished.  Eyes: Conjunctivae are normal. No scleral icterus.  Neck: Neck supple.  Cardiovascular: Normal rate, regular rhythm and normal heart sounds.   Pulmonary/Chest: Effort normal and breath sounds normal. Right breast exhibits no inverted nipple, no mass, no nipple discharge, no skin  change and no tenderness. Left breast exhibits no inverted nipple, no mass, no nipple discharge, no skin change and no tenderness.    Fullness in upper outer quadrant left breast. Tenderness on the left TJ.  Lymphadenopathy:    He has no cervical adenopathy.  Neurological: He is alert and oriented to person, place, and time.  Skin: Skin is warm and dry.    Data Reviewed TestDate:  07-12-2005  Test:  .  Flags:  Normal  Status:  Final   Comments:     Diagnosis:  LEFT BREAST CORE BIOPSY:  MAMMARY TISSUE WITH FIBROSIS AND DUCTAL ADENOSIS.  THE CHANGES IN THE BIOPSY MAY NOT BE REPRESENTATIVE OF THE  OVERALL LESION.  ADDITIONAL BIOPSIES MAY BE HELPFUL FOR BETTER AND MORE COMPLETE  EVALUATION, IF CLINICALLY INDICATED.  IT IS  RECOMMENDED THAT THESE FINDINGS BE CAREFULLY CORRELATED  WITH IMAGING STUDIES AND CLINICAL EXAMINATION TO BE SURE THE  SAMPLE REPRESENTS THE AREA OF CONCERN. PCP notes of 06/20/2015 reviewed. Assessment    Benign left breast exam. Mild visual asymmetry without associated palpable abnormality. Suspected arthritis of the left sternoclavicular joint.     Plan    The patient is minimally symptomatic at present, and specific therapy or assessment of the joint pain is likely not warranted at present. If it worsens, orthopedic assessment might be appropriate.  The patient's breast exam is stable from 2006.     Patient to return as needed.   PCP:  Threasa Alpha 07/14/2015, 3:04 PM

## 2015-07-14 NOTE — Patient Instructions (Signed)
Patient to return as needed. 

## 2015-09-04 DIAGNOSIS — E538 Deficiency of other specified B group vitamins: Secondary | ICD-10-CM | POA: Diagnosis not present

## 2015-10-06 DIAGNOSIS — E538 Deficiency of other specified B group vitamins: Secondary | ICD-10-CM | POA: Diagnosis not present

## 2015-11-06 DIAGNOSIS — G894 Chronic pain syndrome: Secondary | ICD-10-CM | POA: Diagnosis not present

## 2015-11-06 DIAGNOSIS — B0229 Other postherpetic nervous system involvement: Secondary | ICD-10-CM | POA: Diagnosis not present

## 2015-11-11 DIAGNOSIS — I10 Essential (primary) hypertension: Secondary | ICD-10-CM | POA: Diagnosis not present

## 2015-11-11 DIAGNOSIS — B0229 Other postherpetic nervous system involvement: Secondary | ICD-10-CM | POA: Diagnosis not present

## 2015-11-11 DIAGNOSIS — M5136 Other intervertebral disc degeneration, lumbar region: Secondary | ICD-10-CM | POA: Diagnosis not present

## 2015-11-11 DIAGNOSIS — I700 Atherosclerosis of aorta: Secondary | ICD-10-CM | POA: Diagnosis not present

## 2015-11-11 DIAGNOSIS — E538 Deficiency of other specified B group vitamins: Secondary | ICD-10-CM | POA: Diagnosis not present

## 2015-11-14 DIAGNOSIS — M542 Cervicalgia: Secondary | ICD-10-CM | POA: Diagnosis not present

## 2015-12-11 DIAGNOSIS — E538 Deficiency of other specified B group vitamins: Secondary | ICD-10-CM | POA: Diagnosis not present

## 2016-01-12 ENCOUNTER — Other Ambulatory Visit: Payer: Self-pay | Admitting: Physical Medicine and Rehabilitation

## 2016-01-12 DIAGNOSIS — M7541 Impingement syndrome of right shoulder: Secondary | ICD-10-CM | POA: Diagnosis not present

## 2016-01-12 DIAGNOSIS — B0229 Other postherpetic nervous system involvement: Secondary | ICD-10-CM | POA: Diagnosis not present

## 2016-01-12 DIAGNOSIS — M5412 Radiculopathy, cervical region: Secondary | ICD-10-CM | POA: Diagnosis not present

## 2016-01-12 DIAGNOSIS — E538 Deficiency of other specified B group vitamins: Secondary | ICD-10-CM | POA: Diagnosis not present

## 2016-01-12 DIAGNOSIS — M5030 Other cervical disc degeneration, unspecified cervical region: Secondary | ICD-10-CM | POA: Diagnosis not present

## 2016-01-12 DIAGNOSIS — M5414 Radiculopathy, thoracic region: Secondary | ICD-10-CM | POA: Diagnosis not present

## 2016-01-13 DIAGNOSIS — B0229 Other postherpetic nervous system involvement: Secondary | ICD-10-CM | POA: Diagnosis not present

## 2016-02-03 ENCOUNTER — Ambulatory Visit
Admission: RE | Admit: 2016-02-03 | Discharge: 2016-02-03 | Disposition: A | Discharge status: Home / Self Care | Payer: PPO | Source: Ambulatory Visit | Attending: Physical Medicine and Rehabilitation | Admitting: Physical Medicine and Rehabilitation

## 2016-02-03 DIAGNOSIS — M50221 Other cervical disc displacement at C4-C5 level: Secondary | ICD-10-CM | POA: Insufficient documentation

## 2016-02-03 DIAGNOSIS — M5412 Radiculopathy, cervical region: Secondary | ICD-10-CM

## 2016-02-04 DIAGNOSIS — Z125 Encounter for screening for malignant neoplasm of prostate: Secondary | ICD-10-CM | POA: Diagnosis not present

## 2016-02-04 DIAGNOSIS — N62 Hypertrophy of breast: Secondary | ICD-10-CM | POA: Diagnosis not present

## 2016-02-04 DIAGNOSIS — E538 Deficiency of other specified B group vitamins: Secondary | ICD-10-CM | POA: Diagnosis not present

## 2016-02-04 DIAGNOSIS — I10 Essential (primary) hypertension: Secondary | ICD-10-CM | POA: Diagnosis not present

## 2016-02-12 DIAGNOSIS — E784 Other hyperlipidemia: Secondary | ICD-10-CM | POA: Diagnosis not present

## 2016-02-12 DIAGNOSIS — E538 Deficiency of other specified B group vitamins: Secondary | ICD-10-CM | POA: Diagnosis not present

## 2016-02-12 DIAGNOSIS — F325 Major depressive disorder, single episode, in full remission: Secondary | ICD-10-CM | POA: Diagnosis not present

## 2016-02-12 DIAGNOSIS — G4733 Obstructive sleep apnea (adult) (pediatric): Secondary | ICD-10-CM | POA: Diagnosis not present

## 2016-02-12 DIAGNOSIS — B0229 Other postherpetic nervous system involvement: Secondary | ICD-10-CM | POA: Diagnosis not present

## 2016-02-12 DIAGNOSIS — I10 Essential (primary) hypertension: Secondary | ICD-10-CM | POA: Diagnosis not present

## 2016-02-12 DIAGNOSIS — M5136 Other intervertebral disc degeneration, lumbar region: Secondary | ICD-10-CM | POA: Diagnosis not present

## 2016-02-12 DIAGNOSIS — E034 Atrophy of thyroid (acquired): Secondary | ICD-10-CM | POA: Diagnosis not present

## 2016-02-12 DIAGNOSIS — I700 Atherosclerosis of aorta: Secondary | ICD-10-CM | POA: Diagnosis not present

## 2016-02-12 DIAGNOSIS — J4520 Mild intermittent asthma, uncomplicated: Secondary | ICD-10-CM | POA: Diagnosis not present

## 2016-02-16 DIAGNOSIS — M5414 Radiculopathy, thoracic region: Secondary | ICD-10-CM | POA: Diagnosis not present

## 2016-02-16 DIAGNOSIS — M5030 Other cervical disc degeneration, unspecified cervical region: Secondary | ICD-10-CM | POA: Diagnosis not present

## 2016-02-16 DIAGNOSIS — M5020 Other cervical disc displacement, unspecified cervical region: Secondary | ICD-10-CM | POA: Diagnosis not present

## 2016-02-16 DIAGNOSIS — M5412 Radiculopathy, cervical region: Secondary | ICD-10-CM | POA: Diagnosis not present

## 2016-02-16 DIAGNOSIS — B0229 Other postherpetic nervous system involvement: Secondary | ICD-10-CM | POA: Diagnosis not present

## 2016-03-12 DIAGNOSIS — M5030 Other cervical disc degeneration, unspecified cervical region: Secondary | ICD-10-CM | POA: Diagnosis not present

## 2016-03-12 DIAGNOSIS — M5412 Radiculopathy, cervical region: Secondary | ICD-10-CM | POA: Diagnosis not present

## 2016-03-12 DIAGNOSIS — M5020 Other cervical disc displacement, unspecified cervical region: Secondary | ICD-10-CM | POA: Diagnosis not present

## 2016-03-18 DIAGNOSIS — E538 Deficiency of other specified B group vitamins: Secondary | ICD-10-CM | POA: Diagnosis not present

## 2016-04-13 DIAGNOSIS — M5412 Radiculopathy, cervical region: Secondary | ICD-10-CM | POA: Diagnosis not present

## 2016-04-13 DIAGNOSIS — M5020 Other cervical disc displacement, unspecified cervical region: Secondary | ICD-10-CM | POA: Diagnosis not present

## 2016-04-20 DIAGNOSIS — E538 Deficiency of other specified B group vitamins: Secondary | ICD-10-CM | POA: Diagnosis not present

## 2016-05-20 ENCOUNTER — Other Ambulatory Visit: Payer: Self-pay | Admitting: Physical Medicine and Rehabilitation

## 2016-05-20 DIAGNOSIS — M19011 Primary osteoarthritis, right shoulder: Secondary | ICD-10-CM

## 2016-05-20 DIAGNOSIS — B0229 Other postherpetic nervous system involvement: Secondary | ICD-10-CM | POA: Diagnosis not present

## 2016-05-20 DIAGNOSIS — M5414 Radiculopathy, thoracic region: Secondary | ICD-10-CM | POA: Diagnosis not present

## 2016-05-20 DIAGNOSIS — M5030 Other cervical disc degeneration, unspecified cervical region: Secondary | ICD-10-CM | POA: Diagnosis not present

## 2016-05-20 DIAGNOSIS — M19019 Primary osteoarthritis, unspecified shoulder: Secondary | ICD-10-CM | POA: Diagnosis not present

## 2016-05-20 DIAGNOSIS — M19012 Primary osteoarthritis, left shoulder: Secondary | ICD-10-CM | POA: Diagnosis not present

## 2016-05-20 DIAGNOSIS — M5412 Radiculopathy, cervical region: Secondary | ICD-10-CM | POA: Diagnosis not present

## 2016-05-21 DIAGNOSIS — E538 Deficiency of other specified B group vitamins: Secondary | ICD-10-CM | POA: Diagnosis not present

## 2016-05-24 ENCOUNTER — Telehealth: Payer: Self-pay | Admitting: *Deleted

## 2016-05-24 NOTE — Telephone Encounter (Signed)
Patient called and wanted to know if you would see him for RT Shoulder pain. He saw you back in 2016 for left shoulder pain with some swelling in the breast area. He don't know if its the same kind of problem or something different.

## 2016-05-26 ENCOUNTER — Ambulatory Visit
Admission: RE | Admit: 2016-05-26 | Discharge: 2016-05-26 | Disposition: A | Discharge status: Home / Self Care | Payer: PPO | Source: Ambulatory Visit | Attending: Physical Medicine and Rehabilitation | Admitting: Physical Medicine and Rehabilitation

## 2016-05-26 DIAGNOSIS — M19011 Primary osteoarthritis, right shoulder: Secondary | ICD-10-CM | POA: Diagnosis not present

## 2016-05-26 DIAGNOSIS — M7581 Other shoulder lesions, right shoulder: Secondary | ICD-10-CM | POA: Insufficient documentation

## 2016-05-26 DIAGNOSIS — M75111 Incomplete rotator cuff tear or rupture of right shoulder, not specified as traumatic: Secondary | ICD-10-CM | POA: Diagnosis not present

## 2016-05-26 DIAGNOSIS — M25411 Effusion, right shoulder: Secondary | ICD-10-CM | POA: Insufficient documentation

## 2016-05-26 NOTE — Telephone Encounter (Signed)
MRI abnormal right shoulder, patient has an appt w/ Dr Roland Rack in the near future.

## 2016-06-07 DIAGNOSIS — S46101A Unspecified injury of muscle, fascia and tendon of long head of biceps, right arm, initial encounter: Secondary | ICD-10-CM | POA: Diagnosis not present

## 2016-06-07 DIAGNOSIS — M75121 Complete rotator cuff tear or rupture of right shoulder, not specified as traumatic: Secondary | ICD-10-CM | POA: Diagnosis not present

## 2016-06-07 DIAGNOSIS — M7581 Other shoulder lesions, right shoulder: Secondary | ICD-10-CM | POA: Diagnosis not present

## 2016-06-21 DIAGNOSIS — E538 Deficiency of other specified B group vitamins: Secondary | ICD-10-CM | POA: Diagnosis not present

## 2016-07-26 DIAGNOSIS — E538 Deficiency of other specified B group vitamins: Secondary | ICD-10-CM | POA: Diagnosis not present

## 2016-08-06 DIAGNOSIS — S46101D Unspecified injury of muscle, fascia and tendon of long head of biceps, right arm, subsequent encounter: Secondary | ICD-10-CM | POA: Diagnosis not present

## 2016-08-06 DIAGNOSIS — E538 Deficiency of other specified B group vitamins: Secondary | ICD-10-CM | POA: Diagnosis not present

## 2016-08-06 DIAGNOSIS — M75121 Complete rotator cuff tear or rupture of right shoulder, not specified as traumatic: Secondary | ICD-10-CM | POA: Diagnosis not present

## 2016-08-06 DIAGNOSIS — I10 Essential (primary) hypertension: Secondary | ICD-10-CM | POA: Diagnosis not present

## 2016-08-06 DIAGNOSIS — M7581 Other shoulder lesions, right shoulder: Secondary | ICD-10-CM | POA: Diagnosis not present

## 2016-08-13 DIAGNOSIS — E034 Atrophy of thyroid (acquired): Secondary | ICD-10-CM | POA: Diagnosis not present

## 2016-08-13 DIAGNOSIS — M5417 Radiculopathy, lumbosacral region: Secondary | ICD-10-CM | POA: Diagnosis not present

## 2016-08-13 DIAGNOSIS — M5136 Other intervertebral disc degeneration, lumbar region: Secondary | ICD-10-CM | POA: Diagnosis not present

## 2016-08-13 DIAGNOSIS — B0229 Other postherpetic nervous system involvement: Secondary | ICD-10-CM | POA: Diagnosis not present

## 2016-08-13 DIAGNOSIS — I10 Essential (primary) hypertension: Secondary | ICD-10-CM | POA: Diagnosis not present

## 2016-08-13 DIAGNOSIS — K8020 Calculus of gallbladder without cholecystitis without obstruction: Secondary | ICD-10-CM | POA: Diagnosis not present

## 2016-08-13 DIAGNOSIS — I700 Atherosclerosis of aorta: Secondary | ICD-10-CM | POA: Diagnosis not present

## 2016-08-13 DIAGNOSIS — J4520 Mild intermittent asthma, uncomplicated: Secondary | ICD-10-CM | POA: Diagnosis not present

## 2016-08-13 DIAGNOSIS — G4733 Obstructive sleep apnea (adult) (pediatric): Secondary | ICD-10-CM | POA: Diagnosis not present

## 2016-08-13 DIAGNOSIS — E538 Deficiency of other specified B group vitamins: Secondary | ICD-10-CM | POA: Diagnosis not present

## 2016-08-13 DIAGNOSIS — E784 Other hyperlipidemia: Secondary | ICD-10-CM | POA: Diagnosis not present

## 2016-08-13 DIAGNOSIS — F325 Major depressive disorder, single episode, in full remission: Secondary | ICD-10-CM | POA: Diagnosis not present

## 2016-08-26 DIAGNOSIS — E538 Deficiency of other specified B group vitamins: Secondary | ICD-10-CM | POA: Diagnosis not present

## 2016-08-31 DIAGNOSIS — I10 Essential (primary) hypertension: Secondary | ICD-10-CM | POA: Diagnosis not present

## 2016-08-31 DIAGNOSIS — I700 Atherosclerosis of aorta: Secondary | ICD-10-CM | POA: Diagnosis not present

## 2016-08-31 DIAGNOSIS — F325 Major depressive disorder, single episode, in full remission: Secondary | ICD-10-CM | POA: Diagnosis not present

## 2016-08-31 DIAGNOSIS — J069 Acute upper respiratory infection, unspecified: Secondary | ICD-10-CM | POA: Diagnosis not present

## 2016-09-28 DIAGNOSIS — E538 Deficiency of other specified B group vitamins: Secondary | ICD-10-CM | POA: Diagnosis not present

## 2016-10-29 DIAGNOSIS — E538 Deficiency of other specified B group vitamins: Secondary | ICD-10-CM | POA: Diagnosis not present

## 2016-11-08 DIAGNOSIS — M7581 Other shoulder lesions, right shoulder: Secondary | ICD-10-CM | POA: Diagnosis not present

## 2016-11-08 DIAGNOSIS — S46101D Unspecified injury of muscle, fascia and tendon of long head of biceps, right arm, subsequent encounter: Secondary | ICD-10-CM | POA: Diagnosis not present

## 2016-11-08 DIAGNOSIS — M75121 Complete rotator cuff tear or rupture of right shoulder, not specified as traumatic: Secondary | ICD-10-CM | POA: Diagnosis not present

## 2016-11-23 DIAGNOSIS — M6740 Ganglion, unspecified site: Secondary | ICD-10-CM | POA: Diagnosis not present

## 2016-11-29 DIAGNOSIS — E538 Deficiency of other specified B group vitamins: Secondary | ICD-10-CM | POA: Diagnosis not present

## 2017-01-05 DIAGNOSIS — E538 Deficiency of other specified B group vitamins: Secondary | ICD-10-CM | POA: Diagnosis not present

## 2017-02-07 DIAGNOSIS — I10 Essential (primary) hypertension: Secondary | ICD-10-CM | POA: Diagnosis not present

## 2017-02-07 DIAGNOSIS — Z125 Encounter for screening for malignant neoplasm of prostate: Secondary | ICD-10-CM | POA: Diagnosis not present

## 2017-02-07 DIAGNOSIS — I700 Atherosclerosis of aorta: Secondary | ICD-10-CM | POA: Diagnosis not present

## 2017-02-07 DIAGNOSIS — E538 Deficiency of other specified B group vitamins: Secondary | ICD-10-CM | POA: Diagnosis not present

## 2017-02-14 DIAGNOSIS — E784 Other hyperlipidemia: Secondary | ICD-10-CM | POA: Diagnosis not present

## 2017-02-14 DIAGNOSIS — Z0000 Encounter for general adult medical examination without abnormal findings: Secondary | ICD-10-CM | POA: Diagnosis not present

## 2017-02-14 DIAGNOSIS — G4733 Obstructive sleep apnea (adult) (pediatric): Secondary | ICD-10-CM | POA: Diagnosis not present

## 2017-02-14 DIAGNOSIS — B0229 Other postherpetic nervous system involvement: Secondary | ICD-10-CM | POA: Diagnosis not present

## 2017-02-14 DIAGNOSIS — E538 Deficiency of other specified B group vitamins: Secondary | ICD-10-CM | POA: Diagnosis not present

## 2017-02-14 DIAGNOSIS — M5417 Radiculopathy, lumbosacral region: Secondary | ICD-10-CM | POA: Diagnosis not present

## 2017-02-14 DIAGNOSIS — I700 Atherosclerosis of aorta: Secondary | ICD-10-CM | POA: Diagnosis not present

## 2017-02-14 DIAGNOSIS — J4520 Mild intermittent asthma, uncomplicated: Secondary | ICD-10-CM | POA: Diagnosis not present

## 2017-02-14 DIAGNOSIS — I10 Essential (primary) hypertension: Secondary | ICD-10-CM | POA: Diagnosis not present

## 2017-02-14 DIAGNOSIS — E034 Atrophy of thyroid (acquired): Secondary | ICD-10-CM | POA: Diagnosis not present

## 2017-02-14 DIAGNOSIS — M5136 Other intervertebral disc degeneration, lumbar region: Secondary | ICD-10-CM | POA: Diagnosis not present

## 2017-02-14 DIAGNOSIS — F325 Major depressive disorder, single episode, in full remission: Secondary | ICD-10-CM | POA: Diagnosis not present

## 2017-03-22 DIAGNOSIS — E538 Deficiency of other specified B group vitamins: Secondary | ICD-10-CM | POA: Diagnosis not present

## 2017-04-22 DIAGNOSIS — E538 Deficiency of other specified B group vitamins: Secondary | ICD-10-CM | POA: Diagnosis not present

## 2017-05-23 DIAGNOSIS — E538 Deficiency of other specified B group vitamins: Secondary | ICD-10-CM | POA: Diagnosis not present

## 2017-06-24 DIAGNOSIS — E538 Deficiency of other specified B group vitamins: Secondary | ICD-10-CM | POA: Diagnosis not present

## 2017-07-28 DIAGNOSIS — E538 Deficiency of other specified B group vitamins: Secondary | ICD-10-CM | POA: Diagnosis not present

## 2017-08-09 DIAGNOSIS — I10 Essential (primary) hypertension: Secondary | ICD-10-CM | POA: Diagnosis not present

## 2017-08-09 DIAGNOSIS — E538 Deficiency of other specified B group vitamins: Secondary | ICD-10-CM | POA: Diagnosis not present

## 2017-08-09 DIAGNOSIS — E7849 Other hyperlipidemia: Secondary | ICD-10-CM | POA: Diagnosis not present

## 2017-08-16 DIAGNOSIS — M5417 Radiculopathy, lumbosacral region: Secondary | ICD-10-CM | POA: Diagnosis not present

## 2017-08-16 DIAGNOSIS — I700 Atherosclerosis of aorta: Secondary | ICD-10-CM | POA: Diagnosis not present

## 2017-08-16 DIAGNOSIS — E7849 Other hyperlipidemia: Secondary | ICD-10-CM | POA: Diagnosis not present

## 2017-08-16 DIAGNOSIS — F325 Major depressive disorder, single episode, in full remission: Secondary | ICD-10-CM | POA: Diagnosis not present

## 2017-08-16 DIAGNOSIS — G4733 Obstructive sleep apnea (adult) (pediatric): Secondary | ICD-10-CM | POA: Diagnosis not present

## 2017-08-16 DIAGNOSIS — E034 Atrophy of thyroid (acquired): Secondary | ICD-10-CM | POA: Diagnosis not present

## 2017-08-16 DIAGNOSIS — B0229 Other postherpetic nervous system involvement: Secondary | ICD-10-CM | POA: Diagnosis not present

## 2017-08-16 DIAGNOSIS — M5136 Other intervertebral disc degeneration, lumbar region: Secondary | ICD-10-CM | POA: Diagnosis not present

## 2017-08-16 DIAGNOSIS — I10 Essential (primary) hypertension: Secondary | ICD-10-CM | POA: Diagnosis not present

## 2017-08-16 DIAGNOSIS — Z125 Encounter for screening for malignant neoplasm of prostate: Secondary | ICD-10-CM | POA: Diagnosis not present

## 2017-08-16 DIAGNOSIS — E538 Deficiency of other specified B group vitamins: Secondary | ICD-10-CM | POA: Diagnosis not present

## 2017-09-22 ENCOUNTER — Encounter: Payer: Self-pay | Admitting: General Surgery

## 2017-09-22 ENCOUNTER — Ambulatory Visit (INDEPENDENT_AMBULATORY_CARE_PROVIDER_SITE_OTHER): Payer: PPO | Admitting: General Surgery

## 2017-09-22 VITALS — BP 118/80 | HR 81 | Resp 14 | Ht 68.0 in | Wt 192.0 lb

## 2017-09-22 DIAGNOSIS — L989 Disorder of the skin and subcutaneous tissue, unspecified: Secondary | ICD-10-CM

## 2017-09-22 DIAGNOSIS — L820 Inflamed seborrheic keratosis: Secondary | ICD-10-CM | POA: Diagnosis not present

## 2017-09-22 DIAGNOSIS — D216 Benign neoplasm of connective and other soft tissue of trunk, unspecified: Secondary | ICD-10-CM | POA: Diagnosis not present

## 2017-09-22 NOTE — Patient Instructions (Addendum)
The patient is aware to call back for any questions or concerns.  May shower May remove dressing in 2-3 days Return for suture removal  May use an Ice pack as needed for comfort

## 2017-09-22 NOTE — Progress Notes (Signed)
Patient ID: Bradley Holland, male   DOB: 04-19-1946, 72 y.o.   MRN: [id number]  Chief Complaint  Patient presents with  . Mass    HPI PINK Bradley Holland is a 72 y.o. male.  Here for evaluation of two moles on his back that he has noticed for at least a year. He states that they have started bothering him and itching for a couple weeks. Worse sitting in chair and lying in the bed.  HPI  Past Medical History:  Diagnosis Date  . Arthritis   . Asthma   . Hearing loss   . Hypertension   . Hypothyroidism   . Prostate enlargement   . Shingles Jan 2013  . Sleep apnea     Past Surgical History:  Procedure Laterality Date  . BACK SURGERY    . COLONOSCOPY  09/14/2011   Dr. Gustavo Lah  . COLONOSCOPY WITH PROPOFOL N/A 07/09/2015   Procedure: COLONOSCOPY WITH PROPOFOL;  Surgeon: [name], MD;  Location: [facility] ENDOSCOPY;  Service: Endoscopy;  Laterality: N/A;  . NECK SURGERY    . PALATE SURGERY      No family history on file.  Social History Social History   Tobacco Use  . Smoking status: Never Smoker  . Smokeless tobacco: Never Used  Substance Use Topics  . Alcohol use: No  . Drug use: No    Allergies  Allergen Reactions  . Ambien [Zolpidem Tartrate] Other (See Comments)    "crazy"  . Simvastatin Other (See Comments)  . Sulfa Antibiotics Rash    Current Outpatient Medications  Medication Sig Dispense Refill  . ALPRAZolam (XANAX) 0.25 MG tablet TAKE 1 TABLET TWICE A DAY AS NEEDED FOR SLEEP  5  . aspirin 81 MG tablet Take 81 mg by mouth daily.    . B Complex Vitamins (B-COMPLEX/B-12 PO) Take 1 tablet by mouth daily.    Marland Kitchen levothyroxine (SYNTHROID, LEVOTHROID) 100 MCG tablet TAKE 1 TABLET IN THE MORNING    . sertraline (ZOLOFT) 100 MG tablet TAKE 1 TABLET EVERY DAY    . tamsulosin (FLOMAX) 0.4 MG CAPS capsule TAKE 1 CAPSULE (0.4 MG TOTAL) BY MOUTH ONCE DAILY. TAKE 30 MINUTES AFTER SAME MEAL EACH DAY.  11  . traMADol (ULTRAM) 50 MG tablet Take by mouth.    . traZODone  (DESYREL) 50 MG tablet TAKE 1 TABLET AT BEDTIME prn    . triamterene-hydrochlorothiazide (MAXZIDE-25) 37.5-25 MG per tablet Take 1 tablet by mouth daily.    Marland Kitchen lovastatin (MEVACOR) 40 MG tablet TAKE 1 TABLET EVERY DAY    . rOPINIRole (REQUIP) 0.5 MG tablet Take by mouth.     No current facility-administered medications for this visit.     Review of Systems Review of Systems  Constitutional: Negative.   Respiratory: Negative.   Cardiovascular: Negative.     Blood pressure 118/80, pulse 81, resp. rate 14, height 5\' 8"  (1.727 m), weight 192 lb (87.1 kg).  Physical Exam Physical Exam  Constitutional: He is oriented to person, place, and time. He appears well-developed and well-nourished.  Neurological: He is alert and oriented to person, place, and time.  Skin: Skin is warm and dry.  Right lower back a 6-7 mm raised mole and left side a area of seborrheic keratosis.    Psychiatric: His behavior is normal.       Assessment    Raised mole of the right posterior back.    Plan    The area was cleansed with alcohol followed by 10  cc of 0.5% Xylocaine with 0.25% Marcaine with 1-200,000 units of epinephrine.  ChloraPrep was applied to the skin.  The area was excised through an elliptical incision.  Tag at the 12:00 margin.  The wound was closed with interrupted 4-0 Prolene sutures.  Telfa and Tegaderm dressing applied.  Patient tolerated the procedure well.    Return for suture removal  HPI, Physical Exam, Assessment and Plan have been scribed under the direction and in the presence of [name], MD. Karie Fetch, RN  I have completed the exam and reviewed the above documentation for accuracy and completeness.  I agree with the above.  Haematologist has been used and any errors in dictation or transcription are unintentional.  Hervey Ard, M.D., F.A.C.S.  Forest Gleason Nao Linz 09/24/2017, 7:51 AM

## 2017-09-24 DIAGNOSIS — L989 Disorder of the skin and subcutaneous tissue, unspecified: Secondary | ICD-10-CM | POA: Insufficient documentation

## 2017-09-28 ENCOUNTER — Telehealth: Payer: Self-pay | Admitting: *Deleted

## 2017-09-28 NOTE — Telephone Encounter (Signed)
Notified patient as instructed, patient pleased. Discussed follow-up appointments, patient agrees  

## 2017-09-28 NOTE — Telephone Encounter (Signed)
-----   Message from [name], MD sent at 09/27/2017  2:23 PM EST ----- Please let the patient know that the skin lesion was entirely benign.  Follow-up was planned for suture removal.  Thank you ----- Message ----- From: Interface, Lab In [facility] Sent: 09/26/2017   5:16 PM To: [name], MD

## 2017-09-29 ENCOUNTER — Ambulatory Visit: Payer: PPO

## 2017-09-29 DIAGNOSIS — L989 Disorder of the skin and subcutaneous tissue, unspecified: Secondary | ICD-10-CM

## 2017-09-29 NOTE — Progress Notes (Signed)
Patient ID: Bradley Holland, male   DOB: 11/13/1945, 72 y.o.   MRN: [id number] Patient came in today for a wound check. The wound is clean, with no signs of infection noted, wound edges well approximated. Sutures removed, no steri strips needed. Follow up as needed.

## 2017-09-30 DIAGNOSIS — E538 Deficiency of other specified B group vitamins: Secondary | ICD-10-CM | POA: Diagnosis not present

## 2017-10-31 DIAGNOSIS — E538 Deficiency of other specified B group vitamins: Secondary | ICD-10-CM | POA: Diagnosis not present

## 2018-01-20 DIAGNOSIS — E538 Deficiency of other specified B group vitamins: Secondary | ICD-10-CM | POA: Diagnosis not present

## 2018-01-28 IMAGING — MR MR SHOULDER*R* W/O CM
5 series · 40 of 40 positions shown · non-contrast
Comparison: None.

CLINICAL DATA: The decreased range of motion, no weakness. Pain
with movement.

EXAM:
MRI OF THE RIGHT SHOULDER WITHOUT CONTRAST
TECHNIQUE: Multiplanar, multisequence MR imaging of the shoulder was performed.
No intravenous contrast was administered.

[Series 3: T2 fat-sat · axial · 4.0mm · 0.55mm/px · z∈[+1,+94]mm · 9 of 23 slices shown (1 of 3)]
[im 1/23]
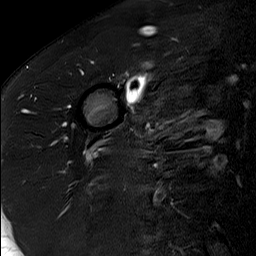
[im 3/23]
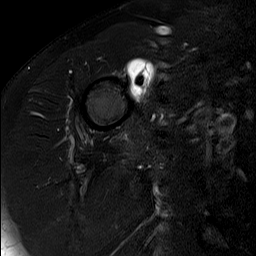
[im 6/23]
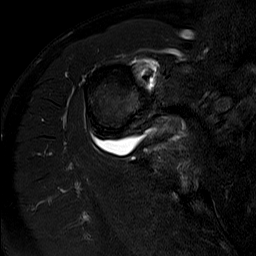
[im 9/23]
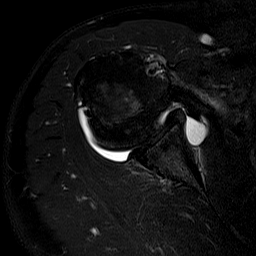
[im 12/23]
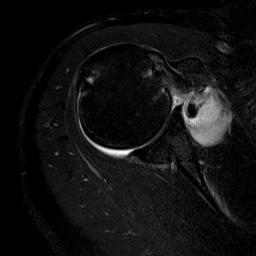
[im 14/23]
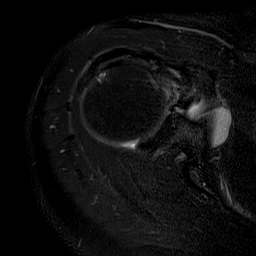
[im 17/23]
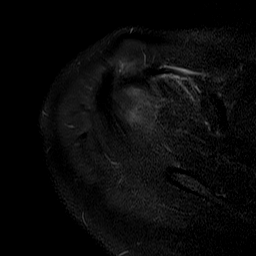
[im 20/23]
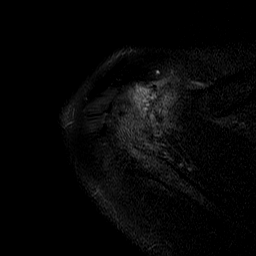
[im 23/23]
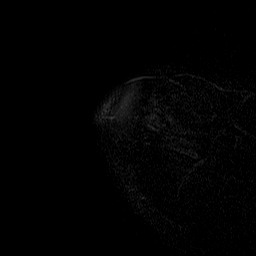

[Series 4: T2 fat-sat · oblique · 4.0mm · 0.59mm/px · 8 of 20 slices shown (2 of 3)]
[im 1/20]
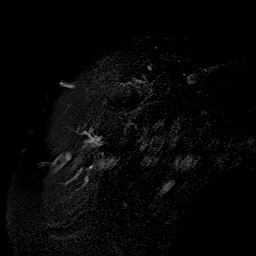
[im 3/20]
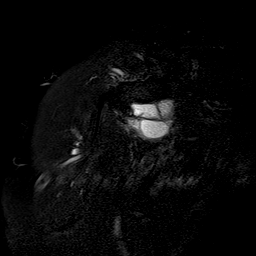
[im 6/20]
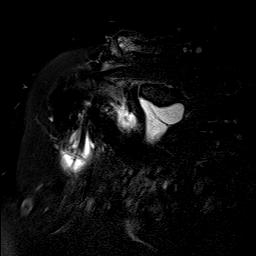
[im 9/20]
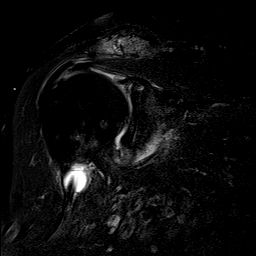
[im 11/20]
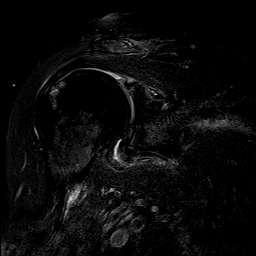
[im 14/20]
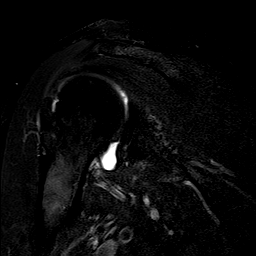
[im 17/20]
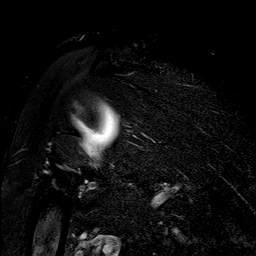
[im 20/20]
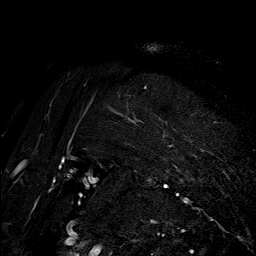

[Series 5: PD · oblique · 4.0mm · 0.59mm/px · 7 of 20 slices shown]
[im 1/20]
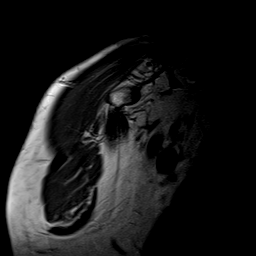
[im 4/20]
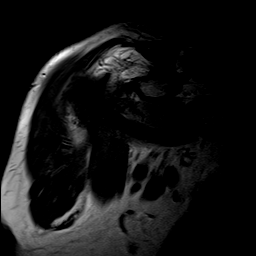
[im 7/20]
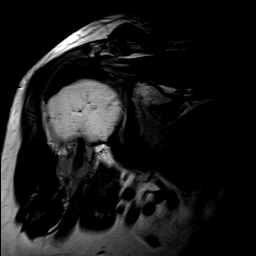
[im 10/20]
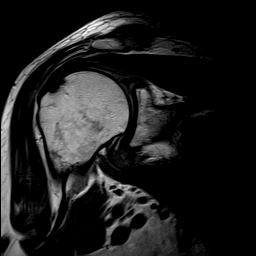
[im 13/20]
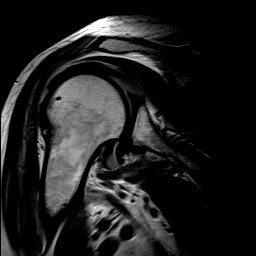
[im 16/20]
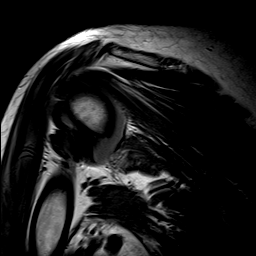
[im 20/20]
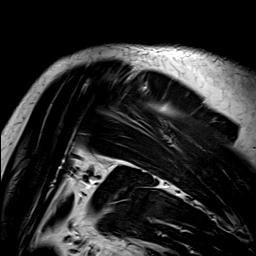

[Series 6: T1 · oblique · 4.0mm · 0.59mm/px · 8 of 23 slices shown]
[im 1/23]
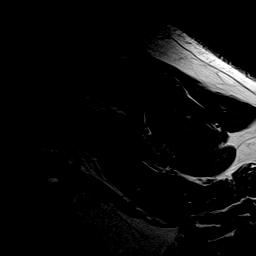
[im 4/23]
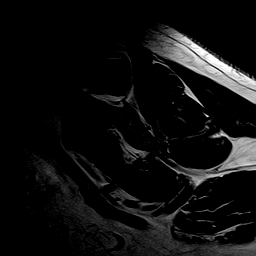
[im 7/23]
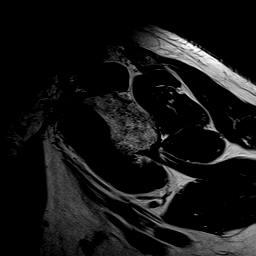
[im 10/23]
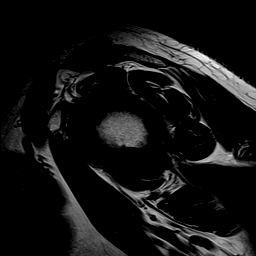
[im 13/23]
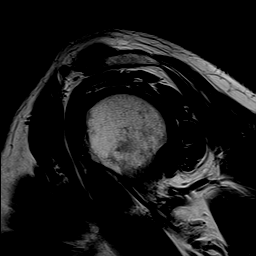
[im 16/23]
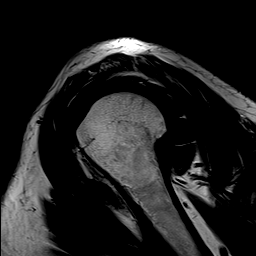
[im 19/23]
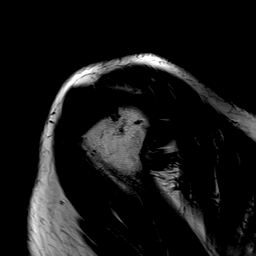
[im 23/23]
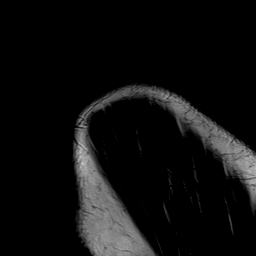

[Series 7: T2 fat-sat · oblique · 4.0mm · 0.59mm/px · 8 of 23 slices shown (3 of 3)]
[im 1/23]
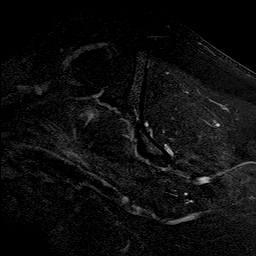
[im 4/23]
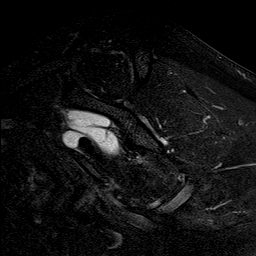
[im 7/23]
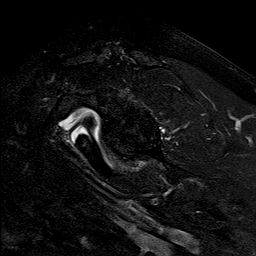
[im 10/23]
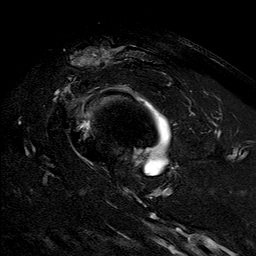
[im 13/23]
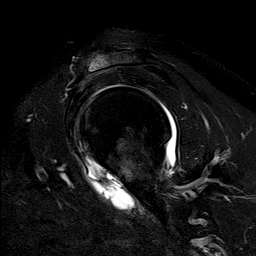
[im 16/23]
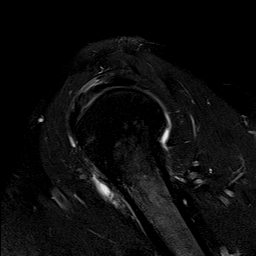
[im 19/23]
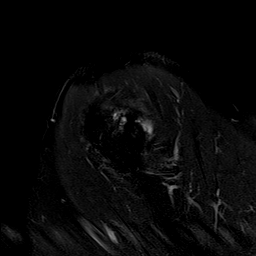
[im 23/23]
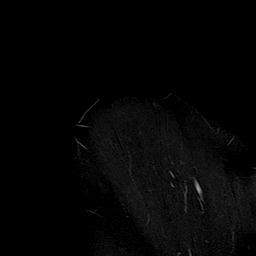

[40 of 40 positions shown; findings below may reference images not displayed]

FINDINGS: Rotator cuff: Severe tendinosis of the supraspinatus tendon with a
small partial thickness articular surface tear with a possible
full-thickness component anteriorly (image 13/series 4). Mild
tendinosis of the infraspinatus tendon. Teres minor tendon is
intact. Severe tendinosis of the subscapularis tendon.

Muscles: No atrophy or fatty replacement of nor abnormal signal
within, the muscles of the rotator cuff.

Biceps long head: Severe tendinosis of the long head of the biceps
tendon with a partial tear at the bicipito-labro complex.

Acromioclavicular Joint: Severe arthropathy of the acromioclavicular
joint. Type II acromion. No significant subacromial/subdeltoid
bursal fluid.

Glenohumeral Joint: Moderate joint effusion. Chondromalacia of the
glenohumeral joint with subchondral reactive marrow changes in the
superior glenoid.

Labrum:  Degeneration of the superior labrum.

Bones: No other marrow signal abnormality. No fracture or
dislocation.

Other: No fluid collection or hematoma.
IMPRESSION: 1. Severe tendinosis of the supraspinatus tendon with a small
partial thickness articular surface tear with a possible
full-thickness component anteriorly (image 13/series 4).
2. Mild tendinosis of the infraspinatus tendon.
3. Severe tendinosis of the subscapularis tendon.
4. Severe tendinosis of the long head of the biceps tendon with a
partial tear at the bicipito-labro complex.
5. Moderate glenohumeral joint effusion.

## 2018-02-09 DIAGNOSIS — I10 Essential (primary) hypertension: Secondary | ICD-10-CM | POA: Diagnosis not present

## 2018-02-09 DIAGNOSIS — I700 Atherosclerosis of aorta: Secondary | ICD-10-CM | POA: Diagnosis not present

## 2018-02-09 DIAGNOSIS — E7849 Other hyperlipidemia: Secondary | ICD-10-CM | POA: Diagnosis not present

## 2018-02-09 DIAGNOSIS — Z125 Encounter for screening for malignant neoplasm of prostate: Secondary | ICD-10-CM | POA: Diagnosis not present

## 2018-02-09 DIAGNOSIS — E034 Atrophy of thyroid (acquired): Secondary | ICD-10-CM | POA: Diagnosis not present

## 2018-02-09 DIAGNOSIS — E538 Deficiency of other specified B group vitamins: Secondary | ICD-10-CM | POA: Diagnosis not present

## 2018-02-16 ENCOUNTER — Other Ambulatory Visit: Payer: Self-pay | Admitting: Internal Medicine

## 2018-02-16 DIAGNOSIS — E034 Atrophy of thyroid (acquired): Secondary | ICD-10-CM | POA: Diagnosis not present

## 2018-02-16 DIAGNOSIS — F325 Major depressive disorder, single episode, in full remission: Secondary | ICD-10-CM | POA: Diagnosis not present

## 2018-02-16 DIAGNOSIS — G4733 Obstructive sleep apnea (adult) (pediatric): Secondary | ICD-10-CM | POA: Diagnosis not present

## 2018-02-16 DIAGNOSIS — M5417 Radiculopathy, lumbosacral region: Secondary | ICD-10-CM | POA: Diagnosis not present

## 2018-02-16 DIAGNOSIS — M5136 Other intervertebral disc degeneration, lumbar region: Secondary | ICD-10-CM | POA: Diagnosis not present

## 2018-02-16 DIAGNOSIS — E7849 Other hyperlipidemia: Secondary | ICD-10-CM | POA: Diagnosis not present

## 2018-02-16 DIAGNOSIS — N5089 Other specified disorders of the male genital organs: Secondary | ICD-10-CM

## 2018-02-16 DIAGNOSIS — I10 Essential (primary) hypertension: Secondary | ICD-10-CM | POA: Diagnosis not present

## 2018-02-16 DIAGNOSIS — Z0000 Encounter for general adult medical examination without abnormal findings: Secondary | ICD-10-CM | POA: Diagnosis not present

## 2018-02-16 DIAGNOSIS — I700 Atherosclerosis of aorta: Secondary | ICD-10-CM | POA: Diagnosis not present

## 2018-02-16 DIAGNOSIS — E538 Deficiency of other specified B group vitamins: Secondary | ICD-10-CM | POA: Diagnosis not present

## 2018-02-16 DIAGNOSIS — R739 Hyperglycemia, unspecified: Secondary | ICD-10-CM | POA: Diagnosis not present

## 2018-02-16 DIAGNOSIS — Z79899 Other long term (current) drug therapy: Secondary | ICD-10-CM | POA: Diagnosis not present

## 2018-02-16 DIAGNOSIS — J4520 Mild intermittent asthma, uncomplicated: Secondary | ICD-10-CM | POA: Diagnosis not present

## 2018-02-22 DIAGNOSIS — E538 Deficiency of other specified B group vitamins: Secondary | ICD-10-CM | POA: Diagnosis not present

## 2018-02-27 ENCOUNTER — Ambulatory Visit
Admission: RE | Admit: 2018-02-27 | Discharge: 2018-02-27 | Disposition: A | Discharge status: Home / Self Care | Payer: PPO | Source: Ambulatory Visit | Attending: Internal Medicine | Admitting: Internal Medicine

## 2018-02-27 DIAGNOSIS — N433 Hydrocele, unspecified: Secondary | ICD-10-CM | POA: Insufficient documentation

## 2018-02-27 DIAGNOSIS — N503 Cyst of epididymis: Secondary | ICD-10-CM | POA: Insufficient documentation

## 2018-02-27 DIAGNOSIS — N509 Disorder of male genital organs, unspecified: Secondary | ICD-10-CM | POA: Insufficient documentation

## 2018-02-27 DIAGNOSIS — N5089 Other specified disorders of the male genital organs: Secondary | ICD-10-CM

## 2018-03-27 DIAGNOSIS — E538 Deficiency of other specified B group vitamins: Secondary | ICD-10-CM | POA: Diagnosis not present

## 2018-04-27 DIAGNOSIS — E538 Deficiency of other specified B group vitamins: Secondary | ICD-10-CM | POA: Diagnosis not present

## 2018-05-18 DIAGNOSIS — S86912A Strain of unspecified muscle(s) and tendon(s) at lower leg level, left leg, initial encounter: Secondary | ICD-10-CM | POA: Diagnosis not present

## 2018-05-23 DIAGNOSIS — S86912A Strain of unspecified muscle(s) and tendon(s) at lower leg level, left leg, initial encounter: Secondary | ICD-10-CM | POA: Diagnosis not present

## 2018-05-29 DIAGNOSIS — E538 Deficiency of other specified B group vitamins: Secondary | ICD-10-CM | POA: Diagnosis not present

## 2018-06-30 DIAGNOSIS — E538 Deficiency of other specified B group vitamins: Secondary | ICD-10-CM | POA: Diagnosis not present

## 2018-07-31 DIAGNOSIS — E538 Deficiency of other specified B group vitamins: Secondary | ICD-10-CM | POA: Diagnosis not present

## 2018-08-11 DIAGNOSIS — R739 Hyperglycemia, unspecified: Secondary | ICD-10-CM | POA: Diagnosis not present

## 2018-08-11 DIAGNOSIS — E034 Atrophy of thyroid (acquired): Secondary | ICD-10-CM | POA: Diagnosis not present

## 2018-08-11 DIAGNOSIS — E538 Deficiency of other specified B group vitamins: Secondary | ICD-10-CM | POA: Diagnosis not present

## 2018-08-11 DIAGNOSIS — E7849 Other hyperlipidemia: Secondary | ICD-10-CM | POA: Diagnosis not present

## 2018-08-11 DIAGNOSIS — I10 Essential (primary) hypertension: Secondary | ICD-10-CM | POA: Diagnosis not present

## 2018-08-11 DIAGNOSIS — Z79899 Other long term (current) drug therapy: Secondary | ICD-10-CM | POA: Diagnosis not present

## 2018-08-18 DIAGNOSIS — R739 Hyperglycemia, unspecified: Secondary | ICD-10-CM | POA: Diagnosis not present

## 2018-08-18 DIAGNOSIS — E034 Atrophy of thyroid (acquired): Secondary | ICD-10-CM | POA: Diagnosis not present

## 2018-08-18 DIAGNOSIS — I700 Atherosclerosis of aorta: Secondary | ICD-10-CM | POA: Diagnosis not present

## 2018-08-18 DIAGNOSIS — Z23 Encounter for immunization: Secondary | ICD-10-CM | POA: Diagnosis not present

## 2018-08-18 DIAGNOSIS — M5136 Other intervertebral disc degeneration, lumbar region: Secondary | ICD-10-CM | POA: Diagnosis not present

## 2018-08-18 DIAGNOSIS — F325 Major depressive disorder, single episode, in full remission: Secondary | ICD-10-CM | POA: Diagnosis not present

## 2018-08-18 DIAGNOSIS — I10 Essential (primary) hypertension: Secondary | ICD-10-CM | POA: Diagnosis not present

## 2018-08-18 DIAGNOSIS — M5417 Radiculopathy, lumbosacral region: Secondary | ICD-10-CM | POA: Diagnosis not present

## 2018-08-18 DIAGNOSIS — G4733 Obstructive sleep apnea (adult) (pediatric): Secondary | ICD-10-CM | POA: Diagnosis not present

## 2018-08-18 DIAGNOSIS — E7849 Other hyperlipidemia: Secondary | ICD-10-CM | POA: Diagnosis not present

## 2018-08-18 DIAGNOSIS — E538 Deficiency of other specified B group vitamins: Secondary | ICD-10-CM | POA: Diagnosis not present

## 2018-09-05 DIAGNOSIS — E538 Deficiency of other specified B group vitamins: Secondary | ICD-10-CM | POA: Diagnosis not present

## 2018-10-06 DIAGNOSIS — E538 Deficiency of other specified B group vitamins: Secondary | ICD-10-CM | POA: Diagnosis not present

## 2018-11-06 DIAGNOSIS — E538 Deficiency of other specified B group vitamins: Secondary | ICD-10-CM | POA: Diagnosis not present

## 2019-01-10 DIAGNOSIS — E538 Deficiency of other specified B group vitamins: Secondary | ICD-10-CM | POA: Diagnosis not present

## 2019-02-14 DIAGNOSIS — I10 Essential (primary) hypertension: Secondary | ICD-10-CM | POA: Diagnosis not present

## 2019-02-14 DIAGNOSIS — E7849 Other hyperlipidemia: Secondary | ICD-10-CM | POA: Diagnosis not present

## 2019-02-14 DIAGNOSIS — E538 Deficiency of other specified B group vitamins: Secondary | ICD-10-CM | POA: Diagnosis not present

## 2019-02-14 DIAGNOSIS — R739 Hyperglycemia, unspecified: Secondary | ICD-10-CM | POA: Diagnosis not present

## 2019-02-21 DIAGNOSIS — F325 Major depressive disorder, single episode, in full remission: Secondary | ICD-10-CM | POA: Diagnosis not present

## 2019-02-21 DIAGNOSIS — I700 Atherosclerosis of aorta: Secondary | ICD-10-CM | POA: Diagnosis not present

## 2019-02-21 DIAGNOSIS — M5136 Other intervertebral disc degeneration, lumbar region: Secondary | ICD-10-CM | POA: Diagnosis not present

## 2019-02-21 DIAGNOSIS — G4733 Obstructive sleep apnea (adult) (pediatric): Secondary | ICD-10-CM | POA: Diagnosis not present

## 2019-02-21 DIAGNOSIS — E034 Atrophy of thyroid (acquired): Secondary | ICD-10-CM | POA: Diagnosis not present

## 2019-02-21 DIAGNOSIS — I10 Essential (primary) hypertension: Secondary | ICD-10-CM | POA: Diagnosis not present

## 2019-02-21 DIAGNOSIS — Z1211 Encounter for screening for malignant neoplasm of colon: Secondary | ICD-10-CM | POA: Diagnosis not present

## 2019-02-21 DIAGNOSIS — Z0000 Encounter for general adult medical examination without abnormal findings: Secondary | ICD-10-CM | POA: Diagnosis not present

## 2019-02-21 DIAGNOSIS — E7849 Other hyperlipidemia: Secondary | ICD-10-CM | POA: Diagnosis not present

## 2019-02-21 DIAGNOSIS — M5417 Radiculopathy, lumbosacral region: Secondary | ICD-10-CM | POA: Diagnosis not present

## 2019-02-21 DIAGNOSIS — E538 Deficiency of other specified B group vitamins: Secondary | ICD-10-CM | POA: Diagnosis not present

## 2019-03-06 ENCOUNTER — Other Ambulatory Visit: Payer: Self-pay

## 2019-03-06 ENCOUNTER — Encounter: Payer: Self-pay | Admitting: General Surgery

## 2019-03-06 ENCOUNTER — Ambulatory Visit (INDEPENDENT_AMBULATORY_CARE_PROVIDER_SITE_OTHER): Payer: PPO | Admitting: General Surgery

## 2019-03-06 VITALS — BP 156/86 | HR 76 | Temp 97.7°F | Ht 68.0 in | Wt 191.4 lb

## 2019-03-06 DIAGNOSIS — D649 Anemia, unspecified: Secondary | ICD-10-CM

## 2019-03-06 NOTE — Progress Notes (Signed)
Patient ID: Bradley Holland, male   DOB: Oct 06, 1945, 73 y.o.   MRN: [id number]  Chief Complaint  Patient presents with  . Colonoscopy    ref Dr Ramonita Lab eval colonoscopy    HPI Bradley Holland is a 73 y.o. male.  Who presents for a colonoscopy discussion referred by Dr Caryl Comes. The last colonoscopy was completed in November 2016. He states his hemoglobin was low 13.8 and Dr Caryl Comes wanted him evaluated.  Denies any gastrointestinal issues. Bowels move regular and no bleeding noted.  HPI  Past Medical History:  Diagnosis Date  . Arthritis   . Asthma   . Hearing loss   . Hypertension   . Hypothyroidism   . Prostate enlargement   . Shingles Jan 2013  . Sleep apnea     Past Surgical History:  Procedure Laterality Date  . BACK SURGERY    . COLONOSCOPY  09/14/2011   Dr. Gustavo Lah  . COLONOSCOPY WITH PROPOFOL N/A 07/09/2015   Procedure: COLONOSCOPY WITH PROPOFOL;  Surgeon: [name], MD;  Location: [facility] ENDOSCOPY;  Service: Endoscopy;  Laterality: N/A;  . NECK SURGERY    . PALATE SURGERY      No family history on file.  Social History Social History   Tobacco Use  . Smoking status: Never Smoker  . Smokeless tobacco: Never Used  Substance Use Topics  . Alcohol use: No  . Drug use: No    Allergies  Allergen Reactions  . Ambien [Zolpidem Tartrate] Other (See Comments)    "crazy"  . Simvastatin Other (See Comments)  . Sulfa Antibiotics Rash    Current Outpatient Medications  Medication Sig Dispense Refill  . ALPRAZolam (XANAX) 0.25 MG tablet TAKE 1 TABLET TWICE A DAY AS NEEDED FOR SLEEP  5  . aspirin 81 MG tablet Take 81 mg by mouth daily.    . B Complex Vitamins (B-COMPLEX/B-12 PO) Take 1 tablet by mouth daily.    Marland Kitchen levothyroxine (SYNTHROID) 112 MCG tablet PLEASE SEE ATTACHED FOR DETAILED DIRECTIONS    . lovastatin (MEVACOR) 40 MG tablet TAKE 1 TABLET EVERY DAY    . sertraline (ZOLOFT) 100 MG tablet TAKE 1 TABLET EVERY DAY    . tamsulosin (FLOMAX) 0.4 MG CAPS  capsule as needed.   11  . traMADol (ULTRAM) 50 MG tablet Take by mouth.    . triamterene-hydrochlorothiazide (MAXZIDE-25) 37.5-25 MG per tablet Take 1 tablet by mouth daily.     No current facility-administered medications for this visit.     Review of Systems Review of Systems  Constitutional: Negative.   Respiratory: Negative.   Cardiovascular: Negative.     Blood pressure (!) 156/86, pulse 76, temperature 97.7 F (36.5 C), temperature source Temporal, height 5\' 8"  (1.727 m), weight 191 lb 6.4 oz (86.8 kg), SpO2 94 %.  Physical Exam Physical Exam Constitutional:      Appearance: He is well-developed.  HENT:     Mouth/Throat:     Pharynx: No oropharyngeal exudate.  Eyes:     General: No scleral icterus.    Conjunctiva/sclera: Conjunctivae normal.  Neck:     Musculoskeletal: Neck supple.  Cardiovascular:     Rate and Rhythm: Normal rate and regular rhythm.     Heart sounds: Normal heart sounds.  Pulmonary:     Effort: Pulmonary effort is normal.     Breath sounds: Normal breath sounds.  Abdominal:     General: Bowel sounds are normal.     Palpations: Abdomen is soft.  Skin:    General: Skin is warm and dry.  Neurological:     Mental Status: He is alert and oriented to person, place, and time.  Psychiatric:        Behavior: Behavior normal.     Data Reviewed CBC results from February 14, 2019 through February 09, 2018 were reviewed.  Hemoglobin 13 point [place] current study, 14.81-year ago.  MCV up at 93.3 from past exams.  Normal white blood cell count.  Platelet count 190,000.  Comprehensive metabolic panel February 14, 8675 notable for mild fall in the total protein to 5.9 from 6.8.  Normal liver function studies.  Normal electrolytes.  Normal renal function.  Estimated GFR of 73.  Normal hemoglobin A1c of 5.6.  TSH on June 17, thousand 20 elevated at 5.8, previously 3.6.  Colonoscopy of July 09, 2015 completed for personal history of polyps was notable only for  diverticuli.  Repeat exam in fall 2021 planned. 2013 exam: Diverticulosis.  Assessment Interval fall in hemoglobin with normal MCV.  Patient without clinical evidence of bleeding.  Plan  Stool cards x6 for occult blood to be completed.  If stool Hemoccult is positive, we will plan for upper and lower endoscopy.  HPI, assessment, plan and physical exam has been scribed under the direction and in the presence of [name], MD. Karie Fetch, RN  I have completed the exam and reviewed the above documentation for accuracy and completeness.  I agree with the above.  Haematologist has been used and any errors in dictation or transcription are unintentional.  Hervey Ard, M.D., F.A.C.S. Forest Gleason Nakota Elsen 03/07/2019, 8:31 PM

## 2019-03-06 NOTE — Patient Instructions (Addendum)
Complete Stool cards The patient is aware to call back for any questions or new concerns.

## 2019-03-07 DIAGNOSIS — D649 Anemia, unspecified: Secondary | ICD-10-CM | POA: Insufficient documentation

## 2019-03-09 DIAGNOSIS — E538 Deficiency of other specified B group vitamins: Secondary | ICD-10-CM | POA: Diagnosis not present

## 2019-03-15 ENCOUNTER — Other Ambulatory Visit: Payer: Self-pay

## 2019-03-15 ENCOUNTER — Ambulatory Visit (INDEPENDENT_AMBULATORY_CARE_PROVIDER_SITE_OTHER): Payer: PPO | Admitting: *Deleted

## 2019-03-15 DIAGNOSIS — D649 Anemia, unspecified: Secondary | ICD-10-CM | POA: Diagnosis not present

## 2019-03-15 LAB — POC HEMOCCULT BLD/STL (HOME/3-CARD/SCREEN)
Card #2 Fecal Occult Blod, POC: NEGATIVE
Card #3 Fecal Occult Blood, POC: NEGATIVE
Fecal Occult Blood, POC: NEGATIVE

## 2019-03-15 NOTE — Progress Notes (Signed)
The take home hemoccult cards are negative.

## 2019-03-20 ENCOUNTER — Telehealth: Payer: Self-pay | Admitting: *Deleted

## 2019-03-20 NOTE — Telephone Encounter (Signed)
Patient called and is wanting his results from his Hemoccult that was done on 03/15/2019

## 2019-03-21 NOTE — Telephone Encounter (Signed)
Hemocult negative, you may call pt

## 2019-03-23 DIAGNOSIS — E034 Atrophy of thyroid (acquired): Secondary | ICD-10-CM | POA: Diagnosis not present

## 2019-03-23 DIAGNOSIS — E538 Deficiency of other specified B group vitamins: Secondary | ICD-10-CM | POA: Diagnosis not present

## 2019-03-26 NOTE — Telephone Encounter (Signed)
Notified patient as instructed, patient agrees.

## 2019-04-09 DIAGNOSIS — E538 Deficiency of other specified B group vitamins: Secondary | ICD-10-CM | POA: Diagnosis not present

## 2019-05-10 DIAGNOSIS — E538 Deficiency of other specified B group vitamins: Secondary | ICD-10-CM | POA: Diagnosis not present

## 2019-06-11 DIAGNOSIS — E538 Deficiency of other specified B group vitamins: Secondary | ICD-10-CM | POA: Diagnosis not present

## 2019-07-12 DIAGNOSIS — E538 Deficiency of other specified B group vitamins: Secondary | ICD-10-CM | POA: Diagnosis not present

## 2019-08-13 DIAGNOSIS — E538 Deficiency of other specified B group vitamins: Secondary | ICD-10-CM | POA: Diagnosis not present

## 2019-08-21 DIAGNOSIS — E538 Deficiency of other specified B group vitamins: Secondary | ICD-10-CM | POA: Diagnosis not present

## 2019-08-21 DIAGNOSIS — E034 Atrophy of thyroid (acquired): Secondary | ICD-10-CM | POA: Diagnosis not present

## 2019-08-21 DIAGNOSIS — I10 Essential (primary) hypertension: Secondary | ICD-10-CM | POA: Diagnosis not present

## 2019-08-21 DIAGNOSIS — E7849 Other hyperlipidemia: Secondary | ICD-10-CM | POA: Diagnosis not present

## 2019-08-28 DIAGNOSIS — I10 Essential (primary) hypertension: Secondary | ICD-10-CM | POA: Diagnosis not present

## 2019-08-28 DIAGNOSIS — I700 Atherosclerosis of aorta: Secondary | ICD-10-CM | POA: Diagnosis not present

## 2019-08-28 DIAGNOSIS — F325 Major depressive disorder, single episode, in full remission: Secondary | ICD-10-CM | POA: Diagnosis not present

## 2019-08-28 DIAGNOSIS — E034 Atrophy of thyroid (acquired): Secondary | ICD-10-CM | POA: Diagnosis not present

## 2019-08-28 DIAGNOSIS — G4733 Obstructive sleep apnea (adult) (pediatric): Secondary | ICD-10-CM | POA: Diagnosis not present

## 2019-08-28 DIAGNOSIS — Z23 Encounter for immunization: Secondary | ICD-10-CM | POA: Diagnosis not present

## 2019-08-28 DIAGNOSIS — Z79899 Other long term (current) drug therapy: Secondary | ICD-10-CM | POA: Diagnosis not present

## 2019-08-28 DIAGNOSIS — J4520 Mild intermittent asthma, uncomplicated: Secondary | ICD-10-CM | POA: Diagnosis not present

## 2019-08-28 DIAGNOSIS — E538 Deficiency of other specified B group vitamins: Secondary | ICD-10-CM | POA: Diagnosis not present

## 2019-08-28 DIAGNOSIS — E7849 Other hyperlipidemia: Secondary | ICD-10-CM | POA: Diagnosis not present

## 2019-08-28 DIAGNOSIS — Z125 Encounter for screening for malignant neoplasm of prostate: Secondary | ICD-10-CM | POA: Diagnosis not present

## 2019-08-28 DIAGNOSIS — M5417 Radiculopathy, lumbosacral region: Secondary | ICD-10-CM | POA: Diagnosis not present

## 2019-09-13 DIAGNOSIS — E538 Deficiency of other specified B group vitamins: Secondary | ICD-10-CM | POA: Diagnosis not present

## 2019-10-15 DIAGNOSIS — E538 Deficiency of other specified B group vitamins: Secondary | ICD-10-CM | POA: Diagnosis not present

## 2019-10-27 DIAGNOSIS — Z23 Encounter for immunization: Secondary | ICD-10-CM | POA: Diagnosis not present

## 2019-11-15 DIAGNOSIS — E538 Deficiency of other specified B group vitamins: Secondary | ICD-10-CM | POA: Diagnosis not present

## 2019-12-04 ENCOUNTER — Other Ambulatory Visit: Payer: Self-pay | Admitting: General Surgery

## 2019-12-04 DIAGNOSIS — B0229 Other postherpetic nervous system involvement: Secondary | ICD-10-CM | POA: Diagnosis not present

## 2019-12-12 ENCOUNTER — Encounter: Payer: Self-pay | Admitting: Anesthesiology

## 2019-12-12 ENCOUNTER — Ambulatory Visit: Payer: PPO | Attending: Anesthesiology | Admitting: Anesthesiology

## 2019-12-12 ENCOUNTER — Other Ambulatory Visit: Payer: Self-pay

## 2019-12-12 VITALS — BP 146/78 | HR 76 | Temp 98.8°F | Resp 18 | Ht 68.0 in | Wt 180.0 lb

## 2019-12-12 DIAGNOSIS — M47894 Other spondylosis, thoracic region: Secondary | ICD-10-CM | POA: Insufficient documentation

## 2019-12-12 DIAGNOSIS — M5134 Other intervertebral disc degeneration, thoracic region: Secondary | ICD-10-CM | POA: Diagnosis not present

## 2019-12-12 DIAGNOSIS — B0229 Other postherpetic nervous system involvement: Secondary | ICD-10-CM | POA: Insufficient documentation

## 2019-12-12 HISTORY — DX: Other intervertebral disc degeneration, thoracic region: M51.34

## 2019-12-12 NOTE — Patient Instructions (Signed)
____________________________________________________________________________________________  General Risks and Possible Complications  Patient Responsibilities: It is important that you read this as it is part of your informed consent. It is our duty to inform you of the risks and possible complications associated with treatments offered to you. It is your responsibility as a patient to read this and to ask questions about anything that is not clear or that you believe was not covered in this document.  Patient's Rights: You have the right to refuse treatment. You also have the right to change your mind, even after initially having agreed to have the treatment done. However, under this last option, if you wait until the last second to change your mind, you may be charged for the materials used up to that point.  Introduction: Medicine is not an exact science. Everything in Medicine, including the lack of treatment(s), carries the potential for danger, harm, or loss (which is by definition: Risk). In Medicine, a complication is a secondary problem, condition, or disease that can aggravate an already existing one. All treatments carry the risk of possible complications. The fact that a side effects or complications occurs, does not imply that the treatment was conducted incorrectly. It must be clearly understood that these can happen even when everything is done following the highest safety standards.  No treatment: You can choose not to proceed with the proposed treatment alternative. The "PRO(s)" would include: avoiding the risk of complications associated with the therapy. The "CON(s)" would include: not getting any of the treatment benefits. These benefits fall under one of three categories: diagnostic; therapeutic; and/or palliative. Diagnostic benefits include: getting information which can ultimately lead to improvement of the disease or symptom(s). Therapeutic benefits are those associated with the  successful treatment of the disease. Finally, palliative benefits are those related to the decrease of the primary symptoms, without necessarily curing the condition (example: decreasing the pain from a flare-up of a chronic condition, such as incurable terminal cancer).  General Risks and Complications: These are associated to most interventional treatments. They can occur alone, or in combination. They fall under one of the following six (6) categories: no benefit or worsening of symptoms; bleeding; infection; nerve damage; allergic reactions; and/or death. 1. No benefits or worsening of symptoms: In Medicine there are no guarantees, only probabilities. No healthcare provider can ever guarantee that a medical treatment will work, they can only state the probability that it may. Furthermore, there is always the possibility that the condition may worsen, either directly, or indirectly, as a consequence of the treatment. 2. Bleeding: This is more common if the patient is taking a blood thinner, either prescription or over the counter (example: Goody Powders, Fish oil, Aspirin, Garlic, etc.), or if suffering a condition associated with impaired coagulation (example: Hemophilia, cirrhosis of the liver, low platelet counts, etc.). However, even if you do not have one on these, it can still happen. If you have any of these conditions, or take one of these drugs, make sure to notify your treating physician. 3. Infection: This is more common in patients with a compromised immune system, either due to disease (example: diabetes, cancer, human immunodeficiency virus [HIV], etc.), or due to medications or treatments (example: therapies used to treat cancer and rheumatological diseases). However, even if you do not have one on these, it can still happen. If you have any of these conditions, or take one of these drugs, make sure to notify your treating physician. 4. Nerve Damage: This is more common when the   treatment is  an invasive one, but it can also happen with the use of medications, such as those used in the treatment of cancer. The damage can occur to small secondary nerves, or to large primary ones, such as those in the spinal cord and brain. This damage may be temporary or permanent and it may lead to impairments that can range from temporary numbness to permanent paralysis and/or brain death. 5. Allergic Reactions: Any time a substance or material comes in contact with our body, there is the possibility of an allergic reaction. These can range from a mild skin rash (contact dermatitis) to a severe systemic reaction (anaphylactic reaction), which can result in death. 6. Death: In general, any medical intervention can result in death, most of the time due to an unforeseen complication. ____________________________________________________________________________________________  ____________________________________________________________________________________________  Preparing for Procedure with Sedation  Procedure appointments are limited to planned procedures: . No Prescription Refills. . No disability issues will be discussed. . No medication changes will be discussed.  Instructions: . Oral Intake: Do not eat or drink anything for at least 3 hours prior to your procedure. (Exception: Blood Pressure Medication. See below.) . Transportation: Unless otherwise stated by your physician, you may drive yourself after the procedure. . Blood Pressure Medicine: Do not forget to take your blood pressure medicine with a sip of water the morning of the procedure. If your Diastolic (lower reading)is above 100 mmHg, elective cases will be cancelled/rescheduled. . Blood thinners: These will need to be stopped for procedures. Notify our staff if you are taking any blood thinners. Depending on which one you take, there will be specific instructions on how and when to stop it. . Diabetics on insulin: Notify the staff  so that you can be scheduled 1st case in the morning. If your diabetes requires high dose insulin, take only  of your normal insulin dose the morning of the procedure and notify the staff that you have done so. . Preventing infections: Shower with an antibacterial soap the morning of your procedure. . Build-up your immune system: Take 1000 mg of Vitamin C with every meal (3 times a day) the day prior to your procedure. Marland Kitchen Antibiotics: Inform the staff if you have a condition or reason that requires you to take antibiotics before dental procedures. . Pregnancy: If you are pregnant, call and cancel the procedure. . Sickness: If you have a cold, fever, or any active infections, call and cancel the procedure. . Arrival: You must be in the facility at least 30 minutes prior to your scheduled procedure. . Children: Do not bring children with you. . Dress appropriately: Bring dark clothing that you would not mind if they get stained. . Valuables: Do not bring any jewelry or valuables.  Reasons to call and reschedule or cancel your procedure: (Following these recommendations will minimize the risk of a serious complication.) . Surgeries: Avoid having procedures within 2 weeks of any surgery. (Avoid for 2 weeks before or after any surgery). . Flu Shots: Avoid having procedures within 2 weeks of a flu shots or . (Avoid for 2 weeks before or after immunizations). . Barium: Avoid having a procedure within 7-10 days after having had a radiological study involving the use of radiological contrast. (Myelograms, Barium swallow or enema study). . Heart attacks: Avoid any elective procedures or surgeries for the initial 6 months after a "Myocardial Infarction" (Heart Attack). . Blood thinners: It is imperative that you stop these medications before procedures. Let us know if you  if you take any blood thinner.  . Infection: Avoid procedures during or within two weeks of an infection (including chest colds or  gastrointestinal problems). Symptoms associated with infections include: Localized redness, fever, chills, night sweats or profuse sweating, burning sensation when voiding, cough, congestion, stuffiness, runny nose, sore throat, diarrhea, nausea, vomiting, cold or Flu symptoms, recent or current infections. It is specially important if the infection is over the area that we intend to treat. Marland Kitchen Heart and lung problems: Symptoms that may suggest an active cardiopulmonary problem include: cough, chest pain, breathing difficulties or shortness of breath, dizziness, ankle swelling, uncontrolled high or unusually low blood pressure, and/or palpitations. If you are experiencing any of these symptoms, cancel your procedure and contact your primary care physician for an evaluation.  Remember:  Regular Business hours are:  Monday to Thursday 8:00 AM to 4:00 PM  Provider's Schedule: Milinda Pointer, MD:  Procedure days: Tuesday and Thursday 7:30 AM to 4:00 PM  Gillis Santa, MD:  Procedure days: Monday and Wednesday 7:30 AM to 4:00 PM ____________________________________________________________________________________________  ____________________________________________________________________________________________  Preparing for Procedure with Sedation  Procedure appointments are limited to planned procedures: . No Prescription Refills. . No disability issues will be discussed. . No medication changes will be discussed.  Instructions: . Oral Intake: Do not eat or drink anything for at least 3 hours prior to your procedure. (Exception: Blood Pressure Medication. See below.) . Transportation: Unless otherwise stated by your physician, you may drive yourself after the procedure. . Blood Pressure Medicine: Do not forget to take your blood pressure medicine with a sip of water the morning of the procedure. If your Diastolic (lower reading)is above 100 mmHg, elective cases will be  cancelled/rescheduled. . Blood thinners: These will need to be stopped for procedures. Notify our staff if you are taking any blood thinners. Depending on which one you take, there will be specific instructions on how and when to stop it. . Diabetics on insulin: Notify the staff so that you can be scheduled 1st case in the morning. If your diabetes requires high dose insulin, take only  of your normal insulin dose the morning of the procedure and notify the staff that you have done so. . Preventing infections: Shower with an antibacterial soap the morning of your procedure. . Build-up your immune system: Take 1000 mg of Vitamin C with every meal (3 times a day) the day prior to your procedure. Marland Kitchen Antibiotics: Inform the staff if you have a condition or reason that requires you to take antibiotics before dental procedures. . Pregnancy: If you are pregnant, call and cancel the procedure. . Sickness: If you have a cold, fever, or any active infections, call and cancel the procedure. . Arrival: You must be in the facility at least 30 minutes prior to your scheduled procedure. . Children: Do not bring children with you. . Dress appropriately: Bring dark clothing that you would not mind if they get stained. . Valuables: Do not bring any jewelry or valuables.  Reasons to call and reschedule or cancel your procedure: (Following these recommendations will minimize the risk of a serious complication.) . Surgeries: Avoid having procedures within 2 weeks of any surgery. (Avoid for 2 weeks before or after any surgery). . Flu Shots: Avoid having procedures within 2 weeks of a flu shots or . (Avoid for 2 weeks before or after immunizations). . Barium: Avoid having a procedure within 7-10 days after having had a radiological study involving the use of radiological contrast. (  Myelograms, Barium swallow or enema study). . Heart attacks: Avoid any elective procedures or surgeries for the initial 6 months after a  "Myocardial Infarction" (Heart Attack). . Blood thinners: It is imperative that you stop these medications before procedures. Let us know if you if you take any blood thinner.  . Infection: Avoid procedures during or within two weeks of an infection (including chest colds or gastrointestinal problems). Symptoms associated with infections include: Localized redness, fever, chills, night sweats or profuse sweating, burning sensation when voiding, cough, congestion, stuffiness, runny nose, sore throat, diarrhea, nausea, vomiting, cold or Flu symptoms, recent or current infections. It is specially important if the infection is over the area that we intend to treat. Marland Kitchen Heart and lung problems: Symptoms that may suggest an active cardiopulmonary problem include: cough, chest pain, breathing difficulties or shortness of breath, dizziness, ankle swelling, uncontrolled high or unusually low blood pressure, and/or palpitations. If you are experiencing any of these symptoms, cancel your procedure and contact your primary care physician for an evaluation.  Remember:  Regular Business hours are:  Monday to Thursday 8:00 AM to 4:00 PM  Provider's Schedule: Milinda Pointer, MD:  Procedure days: Tuesday and Thursday 7:30 AM to 4:00 PM  Gillis Santa, MD:  Procedure days: Monday and Wednesday 7:30 AM to 4:00 PM ____________________________________________________________________________________________  Epidural Steroid Injection Patient Information  Description: The epidural space surrounds the nerves as they exit the spinal cord.  In some patients, the nerves can be compressed and inflamed by a bulging disc or a tight spinal canal (spinal stenosis).  By injecting steroids into the epidural space, we can bring irritated nerves into direct contact with a potentially helpful medication.  These steroids act directly on the irritated nerves and can reduce swelling and inflammation which often leads to decreased  pain.  Epidural steroids may be injected anywhere along the spine and from the neck to the low back depending upon the location of your pain.   After numbing the skin with local anesthetic (like Novocaine), a small needle is passed into the epidural space slowly.  You may experience a sensation of pressure while this is being done.  The entire block usually last less than 10 minutes.  Conditions which may be treated by epidural steroids:   Low back and leg pain  Neck and arm pain  Spinal stenosis  Post-laminectomy syndrome  Herpes zoster (shingles) pain  Pain from compression fractures  Preparation for the injection:  1. Do not eat any solid food or dairy products within 8 hours of your appointment.  2. You may drink clear liquids up to 3 hours before appointment.  Clear liquids include water, black coffee, juice or soda.  No milk or cream please. 3. You may take your regular medication, including pain medications, with a sip of water before your appointment  Diabetics should hold regular insulin (if taken separately) and take 1/2 normal NPH dos the morning of the procedure.  Carry some sugar containing items with you to your appointment. 4. A driver must accompany you and be prepared to drive you home after your procedure.  5. Bring all your current medications with your. 6. An IV may be inserted and sedation may be given at the discretion of the physician.   7. A blood pressure cuff, EKG and other monitors will often be applied during the procedure.  Some patients may need to have extra oxygen administered for a short period. 8. You will be asked to provide medical information,  including your allergies, prior to the procedure.  We must know immediately if you are taking blood thinners (like Coumadin/Warfarin)  Or if you are allergic to IV iodine contrast (dye). We must know if you could possible be pregnant.  Possible side-effects:  Bleeding from needle site  Infection (rare, may  require surgery)  Nerve injury (rare)  Numbness & tingling (temporary)  Difficulty urinating (rare, temporary)  Spinal headache ( a headache worse with upright posture)  Light -headedness (temporary)  Pain at injection site (several days)  Decreased blood pressure (temporary)  Weakness in arm/leg (temporary)  Pressure sensation in back/neck (temporary)  Call if you experience:  Fever/chills associated with headache or increased back/neck pain.  Headache worsened by an upright position.  New onset weakness or numbness of an extremity below the injection site  Hives or difficulty breathing (go to the emergency room)  Inflammation or drainage at the infection site  Severe back/neck pain  Any new symptoms which are concerning to you  Please note:  Although the local anesthetic injected can often make your back or neck feel good for several hours after the injection, the pain will likely return.  It takes 3-7 days for steroids to work in the epidural space.  You may not notice any pain relief for at least that one week.  If effective, we will often do a series of three injections spaced 3-6 weeks apart to maximally decrease your pain.  After the initial series, we generally will wait several months before considering a repeat injection of the same type.  If you have any questions, please call [phone] [facility]

## 2019-12-12 NOTE — Progress Notes (Signed)
Subjective:  Patient ID: Bradley Holland, male    DOB: 03/13/1946  Age: 74 y.o. MRN: [id number]  CC: Back Pain (right upper)     PROCEDURE: None  HPI Bradley Holland presents for new patient evaluation.  He is a pleasant 74 year old white male with a longstanding history of thoracic and lumbar back pain.  He is reporting on a thoracic pain that is been present since the time he was originally dosed with shingles in 2014.  He was referred by Dr. Loa Socks.  Bradley Holland is describing a burning aching deep disabling pain that starts in the high thoracic area and radiates under his underarm to the level below his right nipple.  This occurs approximately every few days and can be completely disabling.  He does not know when the pain will come on but when it does it lasts for anywhere from a few hours to a day.  He has tried tramadol with limited success and reports that he has been on gabapentin up to 600 mg 3 times a day with no significant improvement.  In the past he has had lumbar epidural steroid injections with limited relief of his lumbar pain but this pain in the thoracic region is different.  He reports that Dr. Tollie Pizza removed a lipoma in the high right thoracic region that was at 1 point suspicious for the primary pain generator but this did not yield much relief.  The pain has persisted.  He has had an MRI back in 2015 that showed evidence of thoracic degenerative disc disease and this was reviewed with him today.  This was present in the high and mid thoracic levels.  There is also evidence of thoracic facet disease.  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   He has a past surgical history that includes Colonoscopy (09/14/2011); Back surgery; Neck surgery; Palate surgery; and Colonoscopy with propofol (N/A,  07/09/2015).   His family history is not on file.He reports that he has never smoked. He has never used smokeless tobacco. He reports that he does not drink alcohol or use drugs.  No procedure found.  No results found for: TOXASSSELUR  Outpatient Medications Prior to Visit  Medication Sig Dispense Refill  . ALPRAZolam (XANAX) 0.25 MG tablet TAKE 1 TABLET TWICE A DAY AS NEEDED FOR SLEEP  5  . aspirin 81 MG tablet Take 81 mg by mouth daily.    . B Complex Vitamins (B-COMPLEX/B-12 PO) Take 1 tablet by mouth daily.    Marland Kitchen levothyroxine (SYNTHROID) 112 MCG tablet PLEASE SEE ATTACHED FOR DETAILED DIRECTIONS    . lovastatin (MEVACOR) 40 MG tablet TAKE 1 TABLET EVERY DAY    . sertraline (ZOLOFT) 100 MG tablet 50 mg daily as needed.     . traMADol (ULTRAM) 50 MG tablet Take 50 mg by mouth as needed.     . triamterene-hydrochlorothiazide (MAXZIDE-25) 37.5-25 MG per tablet Take 1 tablet by mouth daily.    . tamsulosin (FLOMAX) 0.4 MG CAPS capsule as needed.   11   No facility-administered medications prior to visit.   Lab Results  Component Value Date   WBC 5.0 05/25/2007   HGB 15.6 05/25/2007   HCT 44.5 05/25/2007   PLT 234 05/25/2007   GLUCOSE 108 (H) 05/25/2007   ALT 37 05/25/2007   AST 30 05/25/2007   NA 137 05/25/2007  K 3.7 05/25/2007   CL 102 05/25/2007   CREATININE 1.09 05/25/2007   BUN 17 05/25/2007   CO2 26 05/25/2007   INR 0.9 05/25/2007    --------------------------------------------------------------------------------------------------------------------- US SCROTUM W/DOPPLER  Result Date: 02/27/2018 CLINICAL DATA:  Initial evaluation for left-sided scrotal mass, palpated by patient for 1 year. EXAM: SCROTAL ULTRASOUND DOPPLER ULTRASOUND OF THE TESTICLES TECHNIQUE: Complete ultrasound examination of the testicles, epididymis, and other scrotal structures was performed. Color and spectral Doppler ultrasound were also utilized to evaluate blood flow to the testicles.  COMPARISON:  None. FINDINGS: Right testicle Measurements: 3.7 x 2.3 x 2.4 cm. No mass or microlithiasis visualized. Left testicle Measurements: 3.9 x 1.7 x 2.5 cm. No mass or microlithiasis visualized. Right epididymis:  Normal in size and appearance. Left epididymis: Normal in size and appearance. 5 x 4 x 4 mm simple cyst at the left epididymal head. Hydrocele:  Small bilateral hydroceles. Varicocele:  None visualized. Pulsed Doppler interrogation of both testes demonstrates normal low resistance arterial and venous waveforms bilaterally. IMPRESSION: 1. 5 mm simple cyst at the left epididymal head, most consistent with a benign epididymal cyst/spermatocele. Query this as palpable abnormality of concern. No other intra testicular or extratesticular mass. 2. Small bilateral hydroceles. Electronically Signed   By: Jeannine Boga M.D.   On: 02/27/2018 22:59       ---------------------------------------------------------------------------------------------------------------------- Past Medical History:  Diagnosis Date  . Arthritis   . Asthma   . 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 Hearing loss   . Hypertension   . Hypothyroidism   . Prostate enlargement   . Shingles Jan 2013  . Sleep apnea     Past Surgical History:  Procedure Laterality Date  . BACK SURGERY    . COLONOSCOPY  09/14/2011   Dr. Gustavo Lah  . COLONOSCOPY WITH PROPOFOL N/A 07/09/2015   Procedure: COLONOSCOPY WITH PROPOFOL;  Surgeon: [name], MD;  Location: [facility] ENDOSCOPY;  Service: Endoscopy;  Laterality: N/A;  . NECK SURGERY    . PALATE SURGERY      History reviewed. No pertinent family history.  Social History   Tobacco Use  . Smoking status: Never Smoker  . Smokeless tobacco: Never Used  Substance Use Topics  . Alcohol use: No    ---------------------------------------------------------------------------------------------------------------------  Scheduled Meds: Continuous  Infusions: PRN Meds:.   BP (!) 146/78   Pulse 76   Temp 98.8 F (37.1 C)   Resp 18   Ht 5\' 8"  (1.727 m)   Wt 180 lb (81.6 kg)   SpO2 99%   BMI 27.37 kg/m    BP Readings from Last 3 Encounters:  12/12/19 (!) 146/78  03/06/19 (!) 156/86  09/22/17 118/80     Wt Readings from Last 3 Encounters:  12/12/19 180 lb (81.6 kg)  03/06/19 191 lb 6.4 oz (86.8 kg)  09/22/17 192 lb (87.1 kg)     ----------------------------------------------------------------------------------------------------------------------  ROS Review of Systems Cardiac: No angina or palpitations Pulmonary: No dyspnea or rales or shortness of breath Constitutional: No fevers chills nausea or vomiting Musculoskeletal: As above with burning shooting lancinating pain in the right thoracic dermatomal region approximately T6 as described per patient  Objective:  BP (!) 146/78   Pulse 76   Temp 98.8 F (37.1 C)   Resp 18   Ht 5\' 8"  (1.727 m)   Wt 180 lb (81.6 kg)   SpO2 99%   BMI 27.37 kg/m   Physical Exam Patient is alert cooperative compliant and a good historian. Cranial  nerves II through XII appear grossly intact pupils are equally round reactive to light extraocular muscles intact Heart is regular rate and rhythm without murmur or rub Inspection of the thoracic region reveals no evidence of cutaneous scarring consistent with shingles.  There is no evidence of erythema or edema in the thoracic region.  He has no hyperalgesia in the thoracic T6 dermatomal region.  There are no evidence of any trigger points there is a well-healed midline scar from previous lipoma excision approximately T6 right side thoracic.     Assessment & Plan:   Bradley Holland was seen today for back pain.  Diagnoses and all orders for this visit:  Post herpetic neuralgia -     Thoracic Epidural Injection; Future  DDD (degenerative disc disease), thoracic -     Thoracic Epidural Injection; Future  Thoracic facet syndrome -      Thoracic Epidural Injection; Future     ----------------------------------------------------------------------------------------------------------------------  Problem List Items Addressed This Visit      Unprioritized   DDD (degenerative disc disease), thoracic   Relevant Orders   Thoracic Epidural Injection   Post herpetic neuralgia - Primary   Relevant Orders   Thoracic Epidural Injection    Other Visit Diagnoses    Thoracic facet syndrome       Relevant Orders   Thoracic Epidural Injection      ----------------------------------------------------------------------------------------------------------------------  1. DDD (degenerative disc disease), thoracic Upon review of his MRI and history I think he would be a candidate for a thoracic epidural steroid based on his clinical findings and description.  We have talked about the risks and benefits of the procedure with him in full detail and all questions have been answered.  We will look to schedule this at the next available date. - Thoracic Epidural Injection; Future  2. Post herpetic neuralgia Based on his history with previous shingles and the intermittent but severe incapacitating nature, this is consistent with postherpetic neuralgia.  As requested by Dr. Tollie Pizza and per the patient I think a thoracic epidural steroid injection would be indicated to see if this can give him some relief.  Unfortunately he has failed more conservative therapy including gabapentin and tramadol in addition to NSAIDs. - Thoracic Epidural Injection; Future  3. Thoracic facet syndrome As above - Thoracic Epidural Injection; Future    ----------------------------------------------------------------------------------------------------------------------  I am having Bradley Holland maintain his triamterene-hydrochlorothiazide, B Complex Vitamins (B-COMPLEX/B-12 PO), aspirin, lovastatin, sertraline, traMADol, ALPRAZolam, tamsulosin, and  levothyroxine.   No orders of the defined types were placed in this encounter.      Follow-up: No follow-ups on file.    Molli Barrows, MD 2:34 PM  The [place] practitioner database for opioid medications on this patient has been reviewed by me and my staff   Greater than 50% of the total encounter time was spent in counseling and / or coordination of care.     This dictation was performed utilizing Systems analyst.  Please excuse any unintentional or mistaken typographical errors as a result.

## 2019-12-12 NOTE — Progress Notes (Signed)
Safety precautions to be maintained throughout the outpatient stay will include: orient to surroundings, keep bed in low position, maintain call bell within reach at all times, provide assistance with transfer out of bed and ambulation.  

## 2019-12-17 DIAGNOSIS — E538 Deficiency of other specified B group vitamins: Secondary | ICD-10-CM | POA: Diagnosis not present

## 2019-12-18 ENCOUNTER — Ambulatory Visit (HOSPITAL_BASED_OUTPATIENT_CLINIC_OR_DEPARTMENT_OTHER): Payer: PPO | Admitting: Anesthesiology

## 2019-12-18 ENCOUNTER — Other Ambulatory Visit: Payer: Self-pay

## 2019-12-18 ENCOUNTER — Other Ambulatory Visit: Payer: Self-pay | Admitting: Anesthesiology

## 2019-12-18 ENCOUNTER — Ambulatory Visit
Admission: RE | Admit: 2019-12-18 | Discharge: 2019-12-18 | Disposition: A | Discharge status: Home / Self Care | Payer: PPO | Source: Ambulatory Visit | Attending: Anesthesiology | Admitting: Anesthesiology

## 2019-12-18 ENCOUNTER — Encounter: Payer: Self-pay | Admitting: Anesthesiology

## 2019-12-18 VITALS — BP 153/81 | HR 62 | Temp 98.3°F | Resp 18 | Ht 68.0 in | Wt 180.0 lb

## 2019-12-18 DIAGNOSIS — B0229 Other postherpetic nervous system involvement: Secondary | ICD-10-CM | POA: Diagnosis not present

## 2019-12-18 DIAGNOSIS — M5414 Radiculopathy, thoracic region: Secondary | ICD-10-CM | POA: Diagnosis not present

## 2019-12-18 DIAGNOSIS — M5134 Other intervertebral disc degeneration, thoracic region: Secondary | ICD-10-CM | POA: Insufficient documentation

## 2019-12-18 DIAGNOSIS — R52 Pain, unspecified: Secondary | ICD-10-CM

## 2019-12-18 DIAGNOSIS — M47894 Other spondylosis, thoracic region: Secondary | ICD-10-CM

## 2019-12-18 HISTORY — DX: Radiculopathy, thoracic region: M54.14

## 2019-12-18 MED ORDER — TRIAMCINOLONE ACETONIDE 40 MG/ML IJ SUSP
INTRAMUSCULAR | Status: AC
  Filled 2019-12-18: qty 1

## 2019-12-18 MED ORDER — IOHEXOL 180 MG/ML  SOLN
10.0000 mL | Freq: Once | INTRAMUSCULAR | Status: AC | PRN
  Administered 2019-12-18: 10 mL via EPIDURAL

## 2019-12-18 MED ORDER — ROPIVACAINE HCL 2 MG/ML IJ SOLN
INTRAMUSCULAR | Status: AC
  Filled 2019-12-18: qty 10

## 2019-12-18 MED ORDER — ROPIVACAINE HCL 2 MG/ML IJ SOLN
10.0000 mL | Freq: Once | INTRAMUSCULAR | Status: AC
  Administered 2019-12-18: 11:00:00 10 mL via EPIDURAL

## 2019-12-18 MED ORDER — LIDOCAINE HCL (PF) 1 % IJ SOLN
5.0000 mL | Freq: Once | INTRAMUSCULAR | Status: AC
  Administered 2019-12-18: 5 mL via SUBCUTANEOUS

## 2019-12-18 MED ORDER — LIDOCAINE HCL (PF) 1 % IJ SOLN
INTRAMUSCULAR | Status: AC
  Filled 2019-12-18: qty 5

## 2019-12-18 MED ORDER — IOHEXOL 180 MG/ML  SOLN
INTRAMUSCULAR | Status: AC
  Filled 2019-12-18: qty 20

## 2019-12-18 MED ORDER — SODIUM CHLORIDE (PF) 0.9 % IJ SOLN
INTRAMUSCULAR | Status: AC
  Filled 2019-12-18: qty 10

## 2019-12-18 MED ORDER — TRIAMCINOLONE ACETONIDE 40 MG/ML IJ SUSP
40.0000 mg | Freq: Once | INTRAMUSCULAR | Status: AC
  Administered 2019-12-18: 40 mg

## 2019-12-18 MED ORDER — SODIUM CHLORIDE 0.9% FLUSH
10.0000 mL | Freq: Once | INTRAVENOUS | Status: AC
  Administered 2019-12-18: 10 mL

## 2019-12-18 NOTE — Progress Notes (Signed)
Subjective:  Patient ID: Bradley Holland, male    DOB: Sep 18, 1945  Age: 74 y.o. MRN: [id number]  CC: Back Pain (upper back, thoracic)   Procedure: T7-T8 thoracic epidural steroid under fluoroscopic guidance without sedation  HPI Merwin E Dodgen presents for reevaluation.  Mr. Alhassan presents for his epidural injection today.  The quality characteristic and distribution of his midthoracic pain and radiating pain with postherpetic neuralgia symptoms has been recurrent but stable in nature.  He desires to proceed with a thoracic epidural today to see if this can give him some relief.  Otherwise he is in his usual state of health no new other changes reported.  Upper and lower extremity strength been stable  Outpatient Medications Prior to Visit  Medication Sig Dispense Refill  . ALPRAZolam (XANAX) 0.25 MG tablet TAKE 1 TABLET TWICE A DAY AS NEEDED FOR SLEEP  5  . aspirin 81 MG tablet Take 81 mg by mouth daily.    . B Complex Vitamins (B-COMPLEX/B-12 PO) Take 1 tablet by mouth daily.    Marland Kitchen levothyroxine (SYNTHROID) 112 MCG tablet PLEASE SEE ATTACHED FOR DETAILED DIRECTIONS    . lovastatin (MEVACOR) 40 MG tablet TAKE 1 TABLET EVERY DAY    . sertraline (ZOLOFT) 100 MG tablet 50 mg daily as needed.     . tamsulosin (FLOMAX) 0.4 MG CAPS capsule as needed.   11  . traMADol (ULTRAM) 50 MG tablet Take 50 mg by mouth as needed.     . triamterene-hydrochlorothiazide (MAXZIDE-25) 37.5-25 MG per tablet Take 1 tablet by mouth daily.     No facility-administered medications prior to visit.    Review of Systems CNS: No confusion or sedation Cardiac: No angina or palpitations GI: No abdominal pain or constipation Constitutional: No nausea vomiting fevers or chills  Objective:  BP (!) 153/81   Pulse 62   Temp 98.3 F (36.8 C) (Oral)   Resp 18   Ht 5\' 8"  (1.727 m)   Wt 180 lb (81.6 kg)   SpO2 99%   BMI 27.37 kg/m    BP Readings from Last 3 Encounters:  12/18/19 (!) 153/81  12/12/19 (!)  146/78  03/06/19 (!) 156/86     Wt Readings from Last 3 Encounters:  12/18/19 180 lb (81.6 kg)  12/12/19 180 lb (81.6 kg)  03/06/19 191 lb 6.4 oz (86.8 kg)     Physical Exam Pt is alert and oriented PERRL EOMI HEART IS RRR no murmur or rub LCTA no wheezing or rales MUSCULOSKELETAL reveals a well-healed midline scar approximately T8 level posteriorly.  No other cutaneous findings are noted.  There is no evidence of any rash.  Labs  No results found for: HGBA1C Lab Results  Component Value Date   CREATININE 1.09 05/25/2007    -------------------------------------------------------------------------------------------------------------------- Lab Results  Component Value Date   WBC 5.0 05/25/2007   HGB 15.6 05/25/2007   HCT 44.5 05/25/2007   PLT 234 05/25/2007   GLUCOSE 108 (H) 05/25/2007   ALT 37 05/25/2007   AST 30 05/25/2007   NA 137 05/25/2007   K 3.7 05/25/2007   CL 102 05/25/2007   CREATININE 1.09 05/25/2007   BUN 17 05/25/2007   CO2 26 05/25/2007   INR 0.9 05/25/2007    --------------------------------------------------------------------------------------------------------------------- DG PAIN CLINIC C-ARM 1-60 MIN NO REPORT  Result Date: 12/18/2019 Fluoro was used, but no Radiologist interpretation will be provided. Please refer to "NOTES" tab for provider progress note.    Assessment & Plan:   Gannen was  seen today for back pain.  Diagnoses and all orders for this visit:  DDD (degenerative disc disease), thoracic -     triamcinolone acetonide (KENALOG-40) injection 40 mg -     sodium chloride flush (NS) 0.9 % injection 10 mL -     ropivacaine (PF) 2 mg/mL (0.2%) (NAROPIN) injection 10 mL -     lidocaine (PF) (XYLOCAINE) 1 % injection 5 mL -     iohexol (OMNIPAQUE) 180 MG/ML injection 10 mL  Post herpetic neuralgia -     triamcinolone acetonide (KENALOG-40) injection 40 mg -     sodium chloride flush (NS) 0.9 % injection 10 mL -     ropivacaine  (PF) 2 mg/mL (0.2%) (NAROPIN) injection 10 mL -     lidocaine (PF) (XYLOCAINE) 1 % injection 5 mL -     iohexol (OMNIPAQUE) 180 MG/ML injection 10 mL  Thoracic facet syndrome -     triamcinolone acetonide (KENALOG-40) injection 40 mg -     sodium chloride flush (NS) 0.9 % injection 10 mL -     ropivacaine (PF) 2 mg/mL (0.2%) (NAROPIN) injection 10 mL -     lidocaine (PF) (XYLOCAINE) 1 % injection 5 mL -     iohexol (OMNIPAQUE) 180 MG/ML injection 10 mL  Radiculitis, thoracic        ----------------------------------------------------------------------------------------------------------------------  Problem List Items Addressed This Visit      Unprioritized   DDD (degenerative disc disease), thoracic - Primary   Post herpetic neuralgia   Radiculitis, thoracic    Other Visit Diagnoses    Thoracic facet syndrome       Relevant Medications   triamcinolone acetonide (KENALOG-40) injection 40 mg (Completed)   sodium chloride flush (NS) 0.9 % injection 10 mL (Completed)   ropivacaine (PF) 2 mg/mL (0.2%) (NAROPIN) injection 10 mL (Completed)   lidocaine (PF) (XYLOCAINE) 1 % injection 5 mL (Completed)   iohexol (OMNIPAQUE) 180 MG/ML injection 10 mL (Completed)        ----------------------------------------------------------------------------------------------------------------------  1. DDD (degenerative disc disease), thoracic I once again reviewed his thoracic MRI and based on his findings today and the distribution of his pain we will plan on the T7-T8 thoracic epidural.  We have gone over the risks and benefits of the procedure with him in full detail and all questions have been answered.  I have him return to clinic in approximately 1 month for reevaluation.  Depending on his response to therapy today we may also proceed with a T5-6 if indicated. - triamcinolone acetonide (KENALOG-40) injection 40 mg - sodium chloride flush (NS) 0.9 % injection 10 mL - ropivacaine (PF) 2  mg/mL (0.2%) (NAROPIN) injection 10 mL - lidocaine (PF) (XYLOCAINE) 1 % injection 5 mL - iohexol (OMNIPAQUE) 180 MG/ML injection 10 mL  2. Post herpetic neuralgia As above - triamcinolone acetonide (KENALOG-40) injection 40 mg - sodium chloride flush (NS) 0.9 % injection 10 mL - ropivacaine (PF) 2 mg/mL (0.2%) (NAROPIN) injection 10 mL - lidocaine (PF) (XYLOCAINE) 1 % injection 5 mL - iohexol (OMNIPAQUE) 180 MG/ML injection 10 mL  3. Thoracic facet syndrome Continue with core stretching strengthening exercises - triamcinolone acetonide (KENALOG-40) injection 40 mg - sodium chloride flush (NS) 0.9 % injection 10 mL - ropivacaine (PF) 2 mg/mL (0.2%) (NAROPIN) injection 10 mL - lidocaine (PF) (XYLOCAINE) 1 % injection 5 mL - iohexol (OMNIPAQUE) 180 MG/ML injection 10 mL  4. Radiculitis, thoracic As above    ----------------------------------------------------------------------------------------------------------------------  I am having Humberto E. Nicole Kindred maintain  his triamterene-hydrochlorothiazide, B Complex Vitamins (B-COMPLEX/B-12 PO), aspirin, lovastatin, sertraline, traMADol, ALPRAZolam, tamsulosin, and levothyroxine. We administered triamcinolone acetonide, sodium chloride flush, ropivacaine (PF) 2 mg/mL (0.2%), lidocaine (PF), and iohexol.   Meds ordered this encounter  Medications  . triamcinolone acetonide (KENALOG-40) injection 40 mg  . sodium chloride flush (NS) 0.9 % injection 10 mL  . ropivacaine (PF) 2 mg/mL (0.2%) (NAROPIN) injection 10 mL  . lidocaine (PF) (XYLOCAINE) 1 % injection 5 mL  . iohexol (OMNIPAQUE) 180 MG/ML injection 10 mL   Patient's Medications  New Prescriptions   No medications on file  Previous Medications   ALPRAZOLAM (XANAX) 0.25 MG TABLET    TAKE 1 TABLET TWICE A DAY AS NEEDED FOR SLEEP   ASPIRIN 81 MG TABLET    Take 81 mg by mouth daily.   B COMPLEX VITAMINS (B-COMPLEX/B-12 PO)    Take 1 tablet by mouth daily.   LEVOTHYROXINE (SYNTHROID)  112 MCG TABLET    PLEASE SEE ATTACHED FOR DETAILED DIRECTIONS   LOVASTATIN (MEVACOR) 40 MG TABLET    TAKE 1 TABLET EVERY DAY   SERTRALINE (ZOLOFT) 100 MG TABLET    50 mg daily as needed.    TAMSULOSIN (FLOMAX) 0.4 MG CAPS CAPSULE    as needed.    TRAMADOL (ULTRAM) 50 MG TABLET    Take 50 mg by mouth as needed.    TRIAMTERENE-HYDROCHLOROTHIAZIDE (MAXZIDE-25) 37.5-25 MG PER TABLET    Take 1 tablet by mouth daily.  Modified Medications   No medications on file  Discontinued Medications   No medications on file   ----------------------------------------------------------------------------------------------------------------------  Follow-up: Return in about 1 month (around 01/17/2020) for evaluation, procedure.   Procedure: Thoracic T7-T8LESI with fluoroscopic guidance and without moderate sedation  NOTE: The risks, benefits, and expectations of the procedure have been discussed and explained to the patient who was understanding and in agreement with suggested treatment plan. No guarantees were made.  DESCRIPTION OF PROCEDURE: Thoracic epidural steroid injection with no IV Versed, EKG, blood pressure, pulse, and pulse oximetry monitoring. The procedure was performed with the patient in the prone position under fluoroscopic guidance.  Sterile prep x3 was initiated and I then injected subcutaneous lidocaine to the overlying T7-T8 site after its fluoroscopic identifictation.  Using strict aseptic technique, I then advanced an 18-gauge Tuohy epidural needle in the midline using interlaminar approach via loss-of-resistance to saline technique. There was negative aspiration for heme or  CSF.  I then confirmed position with both AP and Lateral fluoroscan.  2 cc of contrast dye were injected and a  total of 5 mL of Preservative-Free normal saline mixed with 40 mg of Kenalog and 1cc Ropicaine 0.2 percent were injected incrementally via the  epidurally placed needle. The needle was removed. The patient tolerated  the injection well and was convalesced and discharged to home in stable condition. Should the patient have any post procedure difficulty they have been instructed on how to contact us for assistance.    Molli Barrows, MD

## 2019-12-18 NOTE — Progress Notes (Signed)
Safety precautions to be maintained throughout the outpatient stay will include: orient to surroundings, keep bed in low position, maintain call bell within reach at all times, provide assistance with transfer out of bed and ambulation.   1150 Patient states feeling "woozy" after procedure. VS taken. Dr. Andree Elk aware.                                                                                           Terrilyn Saver RN

## 2019-12-18 NOTE — Progress Notes (Signed)
Dr Andree Elk in to assess patient.Water given. No s/s [id number] denies any dizziness. "I feel goo"

## 2019-12-18 NOTE — Patient Instructions (Signed)
Pain Management Discharge Instructions  General Discharge Instructions :  If you need to reach your doctor call: Monday-Friday 8:00 am - 4:00 pm at [phone] or toll free [phone].  After clinic hours [phone] to have operator reach doctor.  Bring all of your medication bottles to all your appointments in the pain clinic.  To cancel or reschedule your appointment with Pain Management please remember to call 24 hours in advance to avoid a fee.  Refer to the educational materials which you have been given on: General Risks, I had my Procedure. Discharge Instructions, Post Sedation.  Post Procedure Instructions:  The drugs you were given will stay in your system until tomorrow, so for the next 24 hours you should not drive, make any legal decisions or drink any alcoholic beverages.  You may eat anything you prefer, but it is better to start with liquids then soups and crackers, and gradually work up to solid foods.  Please notify your doctor immediately if you have any unusual bleeding, trouble breathing or pain that is not related to your normal pain.  Depending on the type of procedure that was done, some parts of your body may feel week and/or numb.  This usually clears up by tonight or the next day.  Walk with the use of an assistive device or accompanied by an adult for the 24 hours.  You may use ice on the affected area for the first 24 hours.  Put ice in a Ziploc bag and cover with a towel and place against area 15 minutes on 15 minutes off.  You may switch to heat after 24 hours.Epidural Steroid Injection Patient Information  Description: The epidural space surrounds the nerves as they exit the spinal cord.  In some patients, the nerves can be compressed and inflamed by a bulging disc or a tight spinal canal (spinal stenosis).  By injecting steroids into the epidural space, we can bring irritated nerves into direct contact with a potentially helpful medication.  These  steroids act directly on the irritated nerves and can reduce swelling and inflammation which often leads to decreased pain.  Epidural steroids may be injected anywhere along the spine and from the neck to the low back depending upon the location of your pain.   After numbing the skin with local anesthetic (like Novocaine), a small needle is passed into the epidural space slowly.  You may experience a sensation of pressure while this is being done.  The entire block usually last less than 10 minutes.  Conditions which may be treated by epidural steroids:   Low back and leg pain  Neck and arm pain  Spinal stenosis  Post-laminectomy syndrome  Herpes zoster (shingles) pain  Pain from compression fractures  Preparation for the injection:  1. Do not eat any solid food or dairy products within 8 hours of your appointment.  2. You may drink clear liquids up to 3 hours before appointment.  Clear liquids include water, black coffee, juice or soda.  No milk or cream please. 3. You may take your regular medication, including pain medications, with a sip of water before your appointment  Diabetics should hold regular insulin (if taken separately) and take 1/2 normal NPH dos the morning of the procedure.  Carry some sugar containing items with you to your appointment. 4. A driver must accompany you and be prepared to drive you home after your procedure.  5. Bring all your current medications with your. 6. An IV may be inserted and   sedation may be given at the discretion of the physician.   7. A blood pressure cuff, EKG and other monitors will often be applied during the procedure.  Some patients may need to have extra oxygen administered for a short period. 8. You will be asked to provide medical information, including your allergies, prior to the procedure.  We must know immediately if you are taking blood thinners (like Coumadin/Warfarin)  Or if you are allergic to IV iodine contrast (dye). We must  know if you could possible be pregnant.  Possible side-effects:  Bleeding from needle site  Infection (rare, may require surgery)  Nerve injury (rare)  Numbness & tingling (temporary)  Difficulty urinating (rare, temporary)  Spinal headache ( a headache worse with upright posture)  Light -headedness (temporary)  Pain at injection site (several days)  Decreased blood pressure (temporary)  Weakness in arm/leg (temporary)  Pressure sensation in back/neck (temporary)  Call if you experience:  Fever/chills associated with headache or increased back/neck pain.  Headache worsened by an upright position.  New onset weakness or numbness of an extremity below the injection site  Hives or difficulty breathing (go to the emergency room)  Inflammation or drainage at the infection site  Severe back/neck pain  Any new symptoms which are concerning to you  Please note:  Although the local anesthetic injected can often make your back or neck feel good for several hours after the injection, the pain will likely return.  It takes 3-7 days for steroids to work in the epidural space.  You may not notice any pain relief for at least that one week.  If effective, we will often do a series of three injections spaced 3-6 weeks apart to maximally decrease your pain.  After the initial series, we generally will wait several months before considering a repeat injection of the same type.  If you have any questions, please call [phone] [facility] 

## 2019-12-19 ENCOUNTER — Telehealth: Payer: Self-pay | Admitting: *Deleted

## 2019-12-19 NOTE — Telephone Encounter (Signed)
Called for post procedure check. No answer. LVM. 

## 2020-01-15 ENCOUNTER — Encounter: Payer: Self-pay | Admitting: Anesthesiology

## 2020-01-15 ENCOUNTER — Ambulatory Visit
Admission: RE | Admit: 2020-01-15 | Discharge: 2020-01-15 | Disposition: A | Discharge status: Home / Self Care | Payer: PPO | Source: Ambulatory Visit | Attending: Anesthesiology | Admitting: Anesthesiology

## 2020-01-15 ENCOUNTER — Other Ambulatory Visit: Payer: Self-pay | Admitting: Anesthesiology

## 2020-01-15 ENCOUNTER — Other Ambulatory Visit: Payer: Self-pay

## 2020-01-15 ENCOUNTER — Ambulatory Visit (HOSPITAL_BASED_OUTPATIENT_CLINIC_OR_DEPARTMENT_OTHER): Payer: PPO | Admitting: Anesthesiology

## 2020-01-15 VITALS — BP 136/66 | HR 66 | Temp 97.7°F | Resp 16 | Ht 67.0 in | Wt 180.0 lb

## 2020-01-15 DIAGNOSIS — M25512 Pain in left shoulder: Secondary | ICD-10-CM

## 2020-01-15 DIAGNOSIS — M5134 Other intervertebral disc degeneration, thoracic region: Secondary | ICD-10-CM | POA: Insufficient documentation

## 2020-01-15 DIAGNOSIS — G8929 Other chronic pain: Secondary | ICD-10-CM | POA: Insufficient documentation

## 2020-01-15 DIAGNOSIS — B0229 Other postherpetic nervous system involvement: Secondary | ICD-10-CM | POA: Diagnosis not present

## 2020-01-15 DIAGNOSIS — M47894 Other spondylosis, thoracic region: Secondary | ICD-10-CM | POA: Insufficient documentation

## 2020-01-15 DIAGNOSIS — M792 Neuralgia and neuritis, unspecified: Secondary | ICD-10-CM | POA: Diagnosis not present

## 2020-01-15 DIAGNOSIS — M5414 Radiculopathy, thoracic region: Secondary | ICD-10-CM

## 2020-01-15 DIAGNOSIS — R52 Pain, unspecified: Secondary | ICD-10-CM

## 2020-01-15 MED ORDER — IOHEXOL 180 MG/ML  SOLN
INTRAMUSCULAR | Status: AC
  Filled 2020-01-15: qty 20

## 2020-01-15 MED ORDER — SODIUM CHLORIDE (PF) 0.9 % IJ SOLN
INTRAMUSCULAR | Status: AC
  Filled 2020-01-15: qty 10

## 2020-01-15 MED ORDER — LIDOCAINE HCL (PF) 1 % IJ SOLN
INTRAMUSCULAR | Status: AC
  Filled 2020-01-15: qty 5

## 2020-01-15 MED ORDER — TRIAMCINOLONE ACETONIDE 40 MG/ML IJ SUSP
INTRAMUSCULAR | Status: AC
  Filled 2020-01-15: qty 1

## 2020-01-15 MED ORDER — ROPIVACAINE HCL 2 MG/ML IJ SOLN
INTRAMUSCULAR | Status: AC
  Filled 2020-01-15: qty 10

## 2020-01-15 MED ORDER — LIDOCAINE 5 % EX PTCH: 1.0000 | MEDICATED_PATCH | Freq: Two times a day (BID) | CUTANEOUS | 1 refills | Status: AC

## 2020-01-15 NOTE — Progress Notes (Signed)
Safety precautions to be maintained throughout the outpatient stay will include: orient to surroundings, keep bed in low position, maintain call bell within reach at all times, provide assistance with transfer out of bed and ambulation.  

## 2020-01-15 NOTE — Progress Notes (Signed)
Subjective:  Patient ID: Bradley Holland, male    DOB: [birth year]  Age: 74 y.o. MRN: [id number]  CC: Back Pain (lower)   Procedure: None  HPI Bradley Holland presents for return.  He was last seen several weeks ago and had a thoracic epidural at the T7-T8 level.  He reports no significant relief with that.  He continues to have a burning aching pain approximately T8 level right side underneath the shoulder blade the continues to radiate from that area down around the costal margin to the anterior right upper quadrant.  It waxes and wanes and varies in intensity.  Sometimes the pain can be quite troubling.  He has been taking Zoloft and has been on a fentanyl patch that he refused to use.  Furthermore, he has been on capsaicin cream.  He has had injection into the posterior thoracic scar.  The pain was present prior to the removal of a lipoma by Dr. Tollie Pizza.  He reports that this did not improve or exacerbate the pain.  An injection of this area did not resolve the situation either.  He denies any further radiation of pain.  Outpatient Medications Prior to Visit  Medication Sig Dispense Refill  . aspirin 81 MG tablet Take 81 mg by mouth daily.    . B Complex Vitamins (B-COMPLEX/B-12 PO) Take 1 tablet by mouth daily.    Marland Kitchen levothyroxine (SYNTHROID) 112 MCG tablet PLEASE SEE ATTACHED FOR DETAILED DIRECTIONS    . lovastatin (MEVACOR) 40 MG tablet TAKE 1 TABLET EVERY DAY    . sertraline (ZOLOFT) 100 MG tablet 50 mg daily as needed.     . tamsulosin (FLOMAX) 0.4 MG CAPS capsule as needed.   11  . traMADol (ULTRAM) 50 MG tablet Take 50 mg by mouth as needed.     . triamterene-hydrochlorothiazide (MAXZIDE-25) 37.5-25 MG per tablet Take 1 tablet by mouth daily.    Marland Kitchen ALPRAZolam (XANAX) 0.25 MG tablet TAKE 1 TABLET TWICE A DAY AS NEEDED FOR SLEEP  5   No facility-administered medications prior to visit.    Review of Systems CNS: No confusion or sedation Cardiac: No angina or palpitations GI: No  abdominal pain or constipation Constitutional: No nausea vomiting fevers or chills  Objective:  BP 136/66   Pulse 66   Temp 97.7 F (36.5 C)   Resp 16   Ht 5\' 7"  (1.702 m)   Wt 180 lb (81.6 kg)   SpO2 98%   BMI 28.19 kg/m    BP Readings from Last 3 Encounters:  01/15/20 136/66  12/18/19 (!) 153/81  12/12/19 (!) 146/78     Wt Readings from Last 3 Encounters:  01/15/20 180 lb (81.6 kg)  12/18/19 180 lb (81.6 kg)  12/12/19 180 lb (81.6 kg)     Physical Exam Pt is alert and oriented PERRL EOMI HEART IS RRR no murmur or rub LCTA no wheezing or rales MUSCULOSKELETAL reveals a well-healed 3 cm to 4 cm scar just under the right subscapular region.  He is tender to touch in this area.  This does yield radiation in a dermatomal distribution.  There are no cutaneous remarkable findings.  Labs  No results found for: HGBA1C Lab Results  Component Value Date   CREATININE 1.09 05/25/2007    -------------------------------------------------------------------------------------------------------------------- Lab Results  Component Value Date   WBC 5.0 05/25/2007   HGB 15.6 05/25/2007   HCT 44.5 05/25/2007   PLT 234 05/25/2007   GLUCOSE 108 (H) 05/25/2007   ALT  37 05/25/2007   AST 30 05/25/2007   NA 137 05/25/2007   K 3.7 05/25/2007   CL 102 05/25/2007   CREATININE 1.09 05/25/2007   BUN 17 05/25/2007   CO2 26 05/25/2007   INR 0.9 05/25/2007    --------------------------------------------------------------------------------------------------------------------- No results found.   Assessment & Plan:   Bradley Holland was seen today for back pain.  Diagnoses and all orders for this visit:  DDD (degenerative disc disease), thoracic  Post herpetic neuralgia  Thoracic facet syndrome  Radiculitis, thoracic  Chronic left shoulder pain  Neuropathic pain  Other orders -     lidocaine (LIDODERM) 5 %; Place 1 patch onto the skin every 12 (twelve) hours. Remove & Discard  patch within 12 hours or as directed by MD.. Do not exceed 12 hours of continuous exposure.        ----------------------------------------------------------------------------------------------------------------------  Problem List Items Addressed This Visit      Unprioritized   DDD (degenerative disc disease), thoracic - Primary   Left shoulder pain   Neuropathic pain   Post herpetic neuralgia   Radiculitis, thoracic    Other Visit Diagnoses    Thoracic facet syndrome            ----------------------------------------------------------------------------------------------------------------------  1. DDD (degenerative disc disease), thoracic Of note he does have a T5-T6 lateral recess disc protrusion.  We have talked about a second effort at a thoracic epidural in this region to see if this could be the primary source pain generator for him.  2. Post herpetic neuralgia I have also talked him about application of a Lidoderm patch which will be is prescribed today.  He is to try this 12 hours on 12 hours off.  Have also talked to him about application of a TENS unit alternating with the Lidoderm patch.  Lastly he may be a candidate for some physical therapy with iontophoresis in this area.  3. Thoracic facet syndrome   4. Radiculitis, thoracic As above  5. Chronic left shoulder pain   6. Neuropathic pain We may consider moving him from Zoloft over to Effexor as an alternative therapy.  He is scheduled for return to clinic in 1 month as we will see how he responds to the Lidoderm patch    ----------------------------------------------------------------------------------------------------------------------  I am having Bradley Holland start on lidocaine. I am also having him maintain his triamterene-hydrochlorothiazide, B Complex Vitamins (B-COMPLEX/B-12 PO), aspirin, lovastatin, sertraline, traMADol, ALPRAZolam, tamsulosin, and levothyroxine.   Meds ordered this  encounter  Medications  . lidocaine (LIDODERM) 5 %    Sig: Place 1 patch onto the skin every 12 (twelve) hours. Remove & Discard patch within 12 hours or as directed by MD.. Do not exceed 12 hours of continuous exposure.    Dispense:  30 patch    Refill:  1   Patient's Medications  New Prescriptions   LIDOCAINE (LIDODERM) 5 %    Place 1 patch onto the skin every 12 (twelve) hours. Remove & Discard patch within 12 hours or as directed by MD.. Do not exceed 12 hours of continuous exposure.  Previous Medications   ALPRAZOLAM (XANAX) 0.25 MG TABLET    TAKE 1 TABLET TWICE A DAY AS NEEDED FOR SLEEP   ASPIRIN 81 MG TABLET    Take 81 mg by mouth daily.   B COMPLEX VITAMINS (B-COMPLEX/B-12 PO)    Take 1 tablet by mouth daily.   LEVOTHYROXINE (SYNTHROID) 112 MCG TABLET    PLEASE SEE ATTACHED FOR DETAILED DIRECTIONS   LOVASTATIN (MEVACOR)  40 MG TABLET    TAKE 1 TABLET EVERY DAY   SERTRALINE (ZOLOFT) 100 MG TABLET    50 mg daily as needed.    TAMSULOSIN (FLOMAX) 0.4 MG CAPS CAPSULE    as needed.    TRAMADOL (ULTRAM) 50 MG TABLET    Take 50 mg by mouth as needed.    TRIAMTERENE-HYDROCHLOROTHIAZIDE (MAXZIDE-25) 37.5-25 MG PER TABLET    Take 1 tablet by mouth daily.  Modified Medications   No medications on file  Discontinued Medications   No medications on file   ----------------------------------------------------------------------------------------------------------------------  Follow-up: Return in about 1 month (around 02/15/2020), or if symptoms worsen or fail to improve, for evaluation, med refill.    Molli Barrows, MD

## 2020-01-15 NOTE — Patient Instructions (Signed)
A prescription for Lidoderm patch was sent to your pharmacy.

## 2020-01-17 DIAGNOSIS — E538 Deficiency of other specified B group vitamins: Secondary | ICD-10-CM | POA: Diagnosis not present

## 2020-02-15 DIAGNOSIS — I10 Essential (primary) hypertension: Secondary | ICD-10-CM | POA: Diagnosis not present

## 2020-02-15 DIAGNOSIS — Z79899 Other long term (current) drug therapy: Secondary | ICD-10-CM | POA: Diagnosis not present

## 2020-02-15 DIAGNOSIS — E7849 Other hyperlipidemia: Secondary | ICD-10-CM | POA: Diagnosis not present

## 2020-02-15 DIAGNOSIS — E538 Deficiency of other specified B group vitamins: Secondary | ICD-10-CM | POA: Diagnosis not present

## 2020-02-15 DIAGNOSIS — E034 Atrophy of thyroid (acquired): Secondary | ICD-10-CM | POA: Diagnosis not present

## 2020-02-15 DIAGNOSIS — Z125 Encounter for screening for malignant neoplasm of prostate: Secondary | ICD-10-CM | POA: Diagnosis not present

## 2020-02-27 DIAGNOSIS — I10 Essential (primary) hypertension: Secondary | ICD-10-CM | POA: Diagnosis not present

## 2020-02-27 DIAGNOSIS — G4733 Obstructive sleep apnea (adult) (pediatric): Secondary | ICD-10-CM | POA: Diagnosis not present

## 2020-02-27 DIAGNOSIS — E034 Atrophy of thyroid (acquired): Secondary | ICD-10-CM | POA: Diagnosis not present

## 2020-02-27 DIAGNOSIS — E785 Hyperlipidemia, unspecified: Secondary | ICD-10-CM | POA: Diagnosis not present

## 2020-02-27 DIAGNOSIS — J4520 Mild intermittent asthma, uncomplicated: Secondary | ICD-10-CM | POA: Diagnosis not present

## 2020-02-27 DIAGNOSIS — M5417 Radiculopathy, lumbosacral region: Secondary | ICD-10-CM | POA: Diagnosis not present

## 2020-02-27 DIAGNOSIS — E538 Deficiency of other specified B group vitamins: Secondary | ICD-10-CM | POA: Diagnosis not present

## 2020-02-27 DIAGNOSIS — I700 Atherosclerosis of aorta: Secondary | ICD-10-CM | POA: Diagnosis not present

## 2020-02-27 DIAGNOSIS — Z0000 Encounter for general adult medical examination without abnormal findings: Secondary | ICD-10-CM | POA: Diagnosis not present

## 2020-03-19 DIAGNOSIS — J4520 Mild intermittent asthma, uncomplicated: Secondary | ICD-10-CM | POA: Diagnosis not present

## 2020-03-19 DIAGNOSIS — R05 Cough: Secondary | ICD-10-CM | POA: Diagnosis not present

## 2020-03-19 DIAGNOSIS — Z9109 Other allergy status, other than to drugs and biological substances: Secondary | ICD-10-CM | POA: Diagnosis not present

## 2020-03-26 DIAGNOSIS — Z9109 Other allergy status, other than to drugs and biological substances: Secondary | ICD-10-CM | POA: Diagnosis not present

## 2020-03-26 DIAGNOSIS — R05 Cough: Secondary | ICD-10-CM | POA: Diagnosis not present

## 2020-04-01 DIAGNOSIS — E538 Deficiency of other specified B group vitamins: Secondary | ICD-10-CM | POA: Diagnosis not present

## 2020-04-04 DIAGNOSIS — Z20822 Contact with and (suspected) exposure to covid-19: Secondary | ICD-10-CM | POA: Diagnosis not present

## 2020-05-06 DIAGNOSIS — E538 Deficiency of other specified B group vitamins: Secondary | ICD-10-CM | POA: Diagnosis not present

## 2020-06-06 DIAGNOSIS — E538 Deficiency of other specified B group vitamins: Secondary | ICD-10-CM | POA: Diagnosis not present

## 2020-07-07 DIAGNOSIS — E538 Deficiency of other specified B group vitamins: Secondary | ICD-10-CM | POA: Diagnosis not present

## 2020-07-14 DIAGNOSIS — M530 Cervicocranial syndrome: Secondary | ICD-10-CM | POA: Diagnosis not present

## 2020-07-14 DIAGNOSIS — M9901 Segmental and somatic dysfunction of cervical region: Secondary | ICD-10-CM | POA: Diagnosis not present

## 2020-07-16 DIAGNOSIS — M9901 Segmental and somatic dysfunction of cervical region: Secondary | ICD-10-CM | POA: Diagnosis not present

## 2020-07-16 DIAGNOSIS — M530 Cervicocranial syndrome: Secondary | ICD-10-CM | POA: Diagnosis not present

## 2020-07-22 DIAGNOSIS — H35033 Hypertensive retinopathy, bilateral: Secondary | ICD-10-CM | POA: Diagnosis not present

## 2020-07-22 DIAGNOSIS — H3563 Retinal hemorrhage, bilateral: Secondary | ICD-10-CM | POA: Diagnosis not present

## 2020-07-31 DIAGNOSIS — E785 Hyperlipidemia, unspecified: Secondary | ICD-10-CM | POA: Diagnosis not present

## 2020-07-31 DIAGNOSIS — I10 Essential (primary) hypertension: Secondary | ICD-10-CM | POA: Diagnosis not present

## 2020-07-31 DIAGNOSIS — E538 Deficiency of other specified B group vitamins: Secondary | ICD-10-CM | POA: Diagnosis not present

## 2020-08-05 ENCOUNTER — Other Ambulatory Visit: Payer: Self-pay | Admitting: General Surgery

## 2020-08-05 DIAGNOSIS — Z86010 Personal history of colonic polyps: Secondary | ICD-10-CM | POA: Diagnosis not present

## 2020-08-05 NOTE — Progress Notes (Signed)
Subjective:     Patient ID: Bradley Holland is a 74 y.o. male.  HPI  The following portions of the patient's history were reviewed and updated as appropriate.  This an established patient is here today for: office visit. He is here for colonoscopy discussion, last completed 07-09-2015. Bowels move daily, no bleeding.  The patient reports he has been having trouble with his eyes.  He is seen to local optometrist, the last one said that he had blood behind his retina.  He has not been referred to an ophthalmologist.  He reports visual disturbances at night, not so much during the day.  No pain.  He was told by the last optometrist that his vision was "good".  He had had laboratory studies completed earlier this month in preparation for his upcoming physical.  These were reviewed.  TSH is normal.  Cholesterol is up.  He admits that he had discontinued his cholesterol medicine as he was taking "so many medicines".  Cholesterol went up about 35 points off the statin.   Review of Systems  Constitutional: Negative for chills and fever.  Respiratory: Negative for cough.         Chief Complaint  Patient presents with  . Follow-up    colonoscopy     BP 160/88   Pulse 55   Temp 36.6 C (97.9 F)   Ht 172.7 cm (5\' 8" )   Wt 87.1 kg (192 lb)   SpO2 99%   BMI 29.19 kg/m       Past Medical History:  Diagnosis Date  . Abnormal mammogram    On left; evaluated by Dr. Bary Castilla, with negative biopsies  . Aortic atherosclerosis (CMS-HCC)    by screening U/S 6/16  . Arthritis   . Asthma   . B12 deficiency   . Calculus of gallbladder without cholecystitis 06/25/2015   Incidentally noted on CT 10/16  . DDD (degenerative disc disease)    Followed by Dr. Sharlet Salina  . Gynecomastia, male 06/25/2015   On left by CT 10/16; surgical consultation arranged  . HLD (hyperlipidemia)   . HTN (hypertension)   . Hypothyroidism   . Reflux   . Seasonal allergies   . Shingles  08/2011  . Sleep apnea    On CPAP  . Urinary retention due to benign prostatic hyperplasia   . Zoster 08/2012   Postherpetic neuralgia in right T6 distribution          Past Surgical History:  Procedure Laterality Date  . back surgery  2001  . COLONOSCOPY  09/14/2011   01/08/2003, 03/14/2006, 04/16/2008. Ademonatous polyp, tubular adenoma.  . COLONOSCOPY  07/09/2015  . Finger Surgery Right   . Lumbar spine surgeries  2001, 2006   [facility]  . Neck surgery  08/30/2006   [facility]  . neck surgery  2006  . palate surgery     . UPPP  08/30/1992       Social History          Socioeconomic History  . Marital status: Married    Spouse name: Not on file  . Number of children: Not on file  . Years of education: Not on file  . Highest education level: Not on file  Occupational History  . Not on file  Tobacco Use  . Smoking status: Former Smoker    Years: 20.00    Types: Cigars    Quit date: 08/30/1998    Years since quitting: 21.9  . Smokeless tobacco: Former Systems developer  Types: Chew  . Tobacco comment: smoked x1 year   Vaping Use  . Vaping Use: Never used  Substance and Sexual Activity  . Alcohol use: No    Alcohol/week: 0.0 standard drinks  . Drug use: No  . Sexual activity: Defer  Other Topics Concern  . Not on file  Social History Narrative  . Not on file   Social Determinants of Health   Financial Resource Strain: Not on file  Food Insecurity: Not on file  Transportation Needs: Not on file            Allergies  Allergen Reactions  . Ambien [Zolpidem] Other (See Comments)    May have caused confusion  . Simvastatin Muscle Pain  . Sulfa (Sulfonamide Antibiotics) Unknown  . Sulfacetamide Sodium Rash    Current Medications        Current Outpatient Medications  Medication Sig Dispense Refill  . ALPRAZolam (XANAX) 0.25 MG tablet TAKE 1 TABLET BY MOUTH TWICE A DAY AS NEEDED FOR ANXIETY 40 tablet 5  . aspirin 81  MG EC tablet Take 81 mg by mouth once daily    . azelastine (ASTELIN) 137 mcg nasal spray Place 1 spray into both nostrils 2 (two) times daily (Patient taking differently: Place 1 spray into both nostrils as needed  ) 30 mL 5  . cyanocobalamin (VITAMIN B12) 1000 MCG tablet Take 1,000 mcg by mouth once daily    . fluticasone propionate (FLONASE) 50 mcg/actuation nasal spray Place 2 sprays into both nostrils once daily 16 g 11  . levothyroxine (SYNTHROID) 112 MCG tablet TAKE 1 TABLET (112 MCG TOTAL) BY MOUTH ONCE DAILY TAKE ON AN EMPTY STOMACH WITH A GLASS OF WATER AT LEAST 30-60 MINUTES BEFORE BREAKFAST. 90 tablet 1  . lovastatin (MEVACOR) 40 MG tablet TAKE 1 TABLET BY MOUTH EVERY DAY 90 tablet 3  . sertraline (ZOLOFT) 100 MG tablet TAKE 1 TABLET BY MOUTH EVERY DAY 90 tablet 1  . tadalafiL (CIALIS) 5 MG tablet Take 1 tablet (5 mg total) by mouth once daily 30 tablet 11  . traMADoL (ULTRAM) 50 mg tablet TAKE 1 TABLET (50 MG TOTAL) BY MOUTH EVERY 8 (EIGHT) HOURS AS NEEDED FOR PAIN. FOR PAIN 21 tablet 0  . triamterene-hydrochlorothiazide (MAXZIDE-25) 37.5-25 mg tablet TAKE 1 TABLET BY MOUTH EVERY DAY 90 tablet 1  . levocetirizine (XYZAL) 5 MG tablet Take 5 mg by mouth every evening (Patient not taking: Reported on 08/05/2020  )              Current Facility-Administered Medications  Medication Dose Route Frequency Provider Last Rate Last Admin  . cyanocobalamin (VITAMIN B12) injection 1,000 mcg  1,000 mcg Intramuscular Q30 Days Cheryll Cockayne, MD   1,000 mcg at 07/07/20 1122           Family History  Problem Relation Age of Onset  . Other Mother        respiratory disease  . Heart disease Father   . Colon cancer Sister   . Cirrhosis Brother   . Breast cancer Other   . High blood pressure (Hypertension) Other         Objective:   Physical Exam Constitutional:      Appearance: Normal appearance.  Cardiovascular:     Rate and Rhythm: Normal rate and regular  rhythm.     Pulses: Normal pulses.     Heart sounds: Normal heart sounds.  Pulmonary:     Effort: Pulmonary effort is normal.  Breath sounds: Normal breath sounds.  Musculoskeletal:     Cervical back: Neck supple.  Neurological:     Mental Status: He is alert and oriented to person, place, and time.  Psychiatric:        Mood and Affect: Mood normal.        Behavior: Behavior normal.    Labs and Radiology:   April 28, 2015 summary:  Data Reviewed 2004-2007, 2009 colonoscopy reports reviewed. Pathology on the 2007 exam showed a tubulovillous polyp in the proximal ascending colon as well as the mid ascending colon and a tubular adenoma in the ascending colon.  The 2009 exam showed tubular adenomas 3 in the ascending colon and 1 on the proximal transverse colon. 1 in the mid transverse colon. Hyperplastic polyp in the rectal vault.  The patient reports that 2012 exam did not show any polyps.  Pathology on the 2004 exam a which time a 7 mm polyp was removed from the transverse colon any presented with rectal bleeding is not in the hospital computer system.   July 09, 2015 exam: Negative for polyps.     Assessment:     Candidate for follow-up colonoscopy.  Candidate for formal ophthalmologic evaluation.  Elevated cholesterol off medication.    Plan:     The office of Simonne Maffucci, MD, ophthalmologist, has been contacted and they will contact the patient for a formal appointment.  The patient was encouraged to resume his lovastatin.  Indications for repeat colonoscopy reviewed.  This to be scheduled convenient date.    Entered by Karie Fetch, RN, acting as a scribe for Dr. Hervey Ard, MD.  The documentation recorded by the scribe accurately reflects the service I personally performed and the decisions made by me.   [name], MD FACS

## 2020-08-07 DIAGNOSIS — E538 Deficiency of other specified B group vitamins: Secondary | ICD-10-CM | POA: Diagnosis not present

## 2020-08-08 ENCOUNTER — Other Ambulatory Visit: Payer: Self-pay | Admitting: Ophthalmology

## 2020-08-08 DIAGNOSIS — G453 Amaurosis fugax: Secondary | ICD-10-CM

## 2020-08-08 DIAGNOSIS — H35031 Hypertensive retinopathy, right eye: Secondary | ICD-10-CM | POA: Diagnosis not present

## 2020-08-12 ENCOUNTER — Other Ambulatory Visit: Payer: Self-pay

## 2020-08-12 ENCOUNTER — Ambulatory Visit
Admission: RE | Admit: 2020-08-12 | Discharge: 2020-08-12 | Disposition: A | Discharge status: Home / Self Care | Payer: PPO | Source: Ambulatory Visit | Attending: Ophthalmology | Admitting: Ophthalmology

## 2020-08-12 DIAGNOSIS — Z8673 Personal history of transient ischemic attack (TIA), and cerebral infarction without residual deficits: Secondary | ICD-10-CM | POA: Diagnosis not present

## 2020-08-12 DIAGNOSIS — I6523 Occlusion and stenosis of bilateral carotid arteries: Secondary | ICD-10-CM | POA: Diagnosis not present

## 2020-08-12 DIAGNOSIS — G453 Amaurosis fugax: Secondary | ICD-10-CM

## 2020-08-17 ENCOUNTER — Encounter: Payer: Self-pay | Admitting: Emergency Medicine

## 2020-08-17 ENCOUNTER — Emergency Department
Admission: EM | Admit: 2020-08-17 | Discharge: 2020-08-17 | Disposition: A | Discharge status: Home / Self Care | Payer: PPO | Attending: Emergency Medicine | Admitting: Emergency Medicine

## 2020-08-17 ENCOUNTER — Emergency Department: Payer: PPO

## 2020-08-17 ENCOUNTER — Other Ambulatory Visit: Payer: Self-pay

## 2020-08-17 DIAGNOSIS — Z7982 Long term (current) use of aspirin: Secondary | ICD-10-CM | POA: Insufficient documentation

## 2020-08-17 DIAGNOSIS — I10 Essential (primary) hypertension: Secondary | ICD-10-CM | POA: Diagnosis not present

## 2020-08-17 DIAGNOSIS — H539 Unspecified visual disturbance: Secondary | ICD-10-CM | POA: Diagnosis not present

## 2020-08-17 DIAGNOSIS — Z79899 Other long term (current) drug therapy: Secondary | ICD-10-CM | POA: Insufficient documentation

## 2020-08-17 DIAGNOSIS — J45909 Unspecified asthma, uncomplicated: Secondary | ICD-10-CM | POA: Insufficient documentation

## 2020-08-17 DIAGNOSIS — E039 Hypothyroidism, unspecified: Secondary | ICD-10-CM | POA: Diagnosis not present

## 2020-08-17 DIAGNOSIS — R519 Headache, unspecified: Secondary | ICD-10-CM | POA: Insufficient documentation

## 2020-08-17 LAB — BASIC METABOLIC PANEL
Anion gap: 11 (ref 5–15)
BUN: 13 mg/dL (ref 8–23)
CO2: 29 mmol/L (ref 22–32)
Calcium: 10.1 mg/dL (ref 8.9–10.3)
Chloride: 99 mmol/L (ref 98–111)
Creatinine, Ser: 0.95 mg/dL (ref 0.61–1.24)
GFR, Estimated: >60 mL/min (ref >60)
Glucose, Bld: 111 mg/dL — ABNORMAL HIGH (ref 70–99)
Potassium: 3.1 mmol/L — ABNORMAL LOW (ref 3.5–5.1)
Sodium: 139 mmol/L (ref 135–145)

## 2020-08-17 LAB — CBC
HCT: 43.9 % (ref 39.0–52.0)
Hemoglobin: 15.2 g/dL (ref 13.0–17.0)
MCH: 30.3 pg (ref 26.0–34.0)
MCHC: 34.6 g/dL (ref 30.0–36.0)
MCV: 87.6 fL (ref 80.0–100.0)
Platelets: 226 10*3/uL (ref 150–400)
RBC: 5.01 MIL/uL (ref 4.22–5.81)
RDW: 12.7 % (ref 11.5–15.5)
WBC: 5.6 10*3/uL (ref 4.0–10.5)
nRBC: 0 % (ref 0.0–0.2)

## 2020-08-17 LAB — TROPONIN I (HIGH SENSITIVITY)
Troponin I (High Sensitivity): 5 ng/L (ref <18)
Troponin I (High Sensitivity): 6 ng/L (ref <18)

## 2020-08-17 MED ORDER — METOPROLOL TARTRATE 5 MG/5ML IV SOLN
5.0000 mg | Freq: Once | INTRAVENOUS | Status: AC
  Administered 2020-08-17: 18:00:00 5 mg via INTRAVENOUS
  Filled 2020-08-17: qty 5

## 2020-08-17 MED ORDER — ACETAMINOPHEN 325 MG PO TABS
650.0000 mg | ORAL_TABLET | Freq: Once | ORAL | Status: AC | PRN
  Administered 2020-08-17: 650 mg via ORAL
  Filled 2020-08-17: qty 2

## 2020-08-17 MED ORDER — POTASSIUM CHLORIDE CRYS ER 20 MEQ PO TBCR
40.0000 meq | EXTENDED_RELEASE_TABLET | Freq: Once | ORAL | Status: AC
  Administered 2020-08-17: 20:00:00 40 meq via ORAL
  Filled 2020-08-17: qty 2

## 2020-08-17 NOTE — ED Triage Notes (Signed)
Pt arrived via POV with reports of elevated BP, pt states he is having HA and blurred vision, pt states he [facility] to [facility] to have [facility] and was 180/109.  Pt states he has had a HA x 1 month, was supposed to see PCP this past Tuesday but they canceled the appt until Jan 5th.  Pt states he daily BP meds and has not missed any doses.  Pt takes triamterene/HCTZ 37.5/25.

## 2020-08-17 NOTE — Discharge Instructions (Addendum)
Please seek medical attention for any high fevers, chest pain, shortness of breath, change in behavior, persistent vomiting, bloody stool or any other new or concerning symptoms.  

## 2020-08-17 NOTE — ED Provider Notes (Signed)
[facility] Emergency Department Provider Note   ____________________________________________   I have reviewed the triage vital signs and the nursing notes.   HISTORY  Chief Complaint Hypertension and Headache   History limited by: Not Limited   HPI Bradley Holland is a 74 y.o. male who presents to the emergency department today because of concern for headache and hypertension. The patient states that he started having headache for roughly 1 month. He says that he has also been having some blurry vision. Has seen eye doctors for this and was told it would help to have his blood pressure under control.   Records reviewed. Per medical record review patient has a history of HTN  Past Medical History:  Diagnosis Date  . Arthritis   . Asthma   . 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 Hearing loss   . Hypertension   . Hypothyroidism   . Prostate enlargement   . Radiculitis, thoracic 12/18/2019  . Shingles Jan 2013  . Sleep apnea     Patient Active Problem List   Diagnosis Date Noted  . Neuropathic pain 01/15/2020  . Radiculitis, thoracic 12/18/2019  . 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 Normocytic anemia 03/07/2019  . Benign skin lesion of lower back 09/24/2017  . Left shoulder pain 07/14/2015  . Hx of colonic polyps 04/28/2015  . Mass on back 02/13/2014  . Post herpetic neuralgia 02/13/2014    Past Surgical History:  Procedure Laterality Date  . BACK SURGERY    . COLONOSCOPY  09/14/2011   Dr. Gustavo Lah  . COLONOSCOPY WITH PROPOFOL N/A 07/09/2015   Procedure: COLONOSCOPY WITH PROPOFOL;  Surgeon: [name], MD;  Location: [facility] ENDOSCOPY;  Service: Endoscopy;  Laterality: N/A;  . NECK SURGERY    . PALATE SURGERY      Prior to Admission medications   Medication Sig Start Date End Date Taking? Authorizing Provider  ALPRAZolam (XANAX) 0.25 MG tablet TAKE 1 TABLET TWICE A DAY AS NEEDED FOR SLEEP 02/10/15    [provider]  aspirin 81 MG tablet Take 81 mg by mouth daily.    [provider]  B Complex Vitamins (B-COMPLEX/B-12 PO) Take 1 tablet by mouth daily.    [provider]  levothyroxine (SYNTHROID) 112 MCG tablet PLEASE SEE ATTACHED FOR DETAILED DIRECTIONS 02/21/19   [provider]  lovastatin (MEVACOR) 40 MG tablet TAKE 1 TABLET EVERY DAY 12/09/14   [provider]  sertraline (ZOLOFT) 100 MG tablet 50 mg daily as needed.  12/09/14   [provider]  tamsulosin (FLOMAX) 0.4 MG CAPS capsule as needed.  03/12/15   [provider]  traMADol (ULTRAM) 50 MG tablet Take 50 mg by mouth as needed.  12/25/14   [provider]  triamterene-hydrochlorothiazide (MAXZIDE-25) 37.5-25 MG per tablet Take 1 tablet by mouth daily.    [provider]    Allergies Ambien [zolpidem tartrate], Simvastatin, and Sulfa antibiotics  No family history on file.  Social History Social History   Tobacco Use  . Smoking status: Never Smoker  . Smokeless tobacco: Never Used  Substance Use Topics  . Alcohol use: No  . Drug use: No    Review of Systems Constitutional: No fever/chills Eyes: Positive for blurry vision. ENT: No sore throat. Cardiovascular: Denies chest pain. Respiratory: Denies shortness of breath. Gastrointestinal: No abdominal pain.  No nausea, no vomiting.  No diarrhea.   Genitourinary: Negative for dysuria. Musculoskeletal: Negative for back pain. Skin: Negative  for rash. Neurological: Positive for headache. ____________________________________________   PHYSICAL EXAM:  VITAL SIGNS: ED Triage Vitals  Enc Vitals Group     BP 08/17/20 1459 (!) 205/86     Pulse Rate 08/17/20 1459 71     Resp 08/17/20 1459 20     Temp 08/17/20 1459 98.7 F (37.1 C)     Temp Source 08/17/20 1459 Oral     SpO2 08/17/20 1459 99 %     Weight 08/17/20 1454 182 lb (82.6 kg)     Height 08/17/20 1454 5\' 8"  (1.727 m)     Head  Circumference --      Peak Flow --      Pain Score 08/17/20 1454 5   Constitutional: Alert and oriented.  Eyes: Conjunctivae are normal.  ENT      Head: Normocephalic and atraumatic.      Nose: No congestion/rhinnorhea.      Mouth/Throat: Mucous membranes are moist.      Neck: No stridor. Hematological/Lymphatic/Immunilogical: No cervical lymphadenopathy. Cardiovascular: Normal rate, regular rhythm.  No murmurs, rubs, or gallops.  Respiratory: Normal respiratory effort without tachypnea nor retractions. Breath sounds are clear and equal bilaterally. No wheezes/rales/rhonchi. Gastrointestinal: Soft and non tender. No rebound. No guarding.  Genitourinary: Deferred Musculoskeletal: Normal range of motion in all extremities. No lower extremity edema. Neurologic:  Normal speech and language. No gross focal neurologic deficits are appreciated.  Skin:  Skin is warm, dry and intact. No rash noted. Psychiatric: Mood and affect are normal. Speech and behavior are normal. Patient exhibits appropriate insight and judgment.  ____________________________________________    LABS (pertinent positives/negatives)  Trop hs 5 CBC wbc 5.6, hgb 15.2, plt 226 BMP wnl except k 3.1, glu 111  ____________________________________________   EKG  I, Nance Pear, attending physician, personally viewed and interpreted this EKG  EKG Time: 1501 Rate: 80 Rhythm: normal sinus rhythm Axis: normal Intervals: qtc 509 QRS: RBBB ST changes: no st elevation Impression: abnormal ekg  ____________________________________________    RADIOLOGY  CT head No acute intracranial process  ____________________________________________   PROCEDURES  Procedures  ____________________________________________   INITIAL IMPRESSION / ASSESSMENT AND PLAN / ED COURSE  Pertinent labs & imaging results that were available during my care of the patient were reviewed by me and considered in my medical decision  making (see chart for details).   Patient presented to the emergency department today with complaints of headache and high blood pressure.  Patient was certainly hypertensive here in the emergency department.  Work-up without elevation of troponin, creatinine or CTs findings concerning for intracranial pathology.  Patient's headache and vision did improve after both Tylenol blood pressure medication here.  Given that the symptoms have been going on for roughly 1 month given the patient clinically feels improvement do not feel he necessitates admission at this time.  Did discuss with patient importance of close follow-up for further hypertension management.  ____________________________________________   FINAL CLINICAL IMPRESSION(S) / ED DIAGNOSES  Final diagnoses:  Hypertension, unspecified type  Nonintractable headache, unspecified chronicity pattern, unspecified headache type     Note: This dictation was prepared with Dragon dictation. Any transcriptional errors that result from this process are unintentional     Nance Pear, MD 08/17/20 1927

## 2020-08-19 DIAGNOSIS — E538 Deficiency of other specified B group vitamins: Secondary | ICD-10-CM | POA: Diagnosis not present

## 2020-08-19 DIAGNOSIS — E034 Atrophy of thyroid (acquired): Secondary | ICD-10-CM | POA: Diagnosis not present

## 2020-08-19 DIAGNOSIS — E7849 Other hyperlipidemia: Secondary | ICD-10-CM | POA: Diagnosis not present

## 2020-08-19 DIAGNOSIS — G4733 Obstructive sleep apnea (adult) (pediatric): Secondary | ICD-10-CM | POA: Diagnosis not present

## 2020-08-19 DIAGNOSIS — F325 Major depressive disorder, single episode, in full remission: Secondary | ICD-10-CM | POA: Diagnosis not present

## 2020-08-19 DIAGNOSIS — I10 Essential (primary) hypertension: Secondary | ICD-10-CM | POA: Diagnosis not present

## 2020-08-19 DIAGNOSIS — M5136 Other intervertebral disc degeneration, lumbar region: Secondary | ICD-10-CM | POA: Diagnosis not present

## 2020-08-19 DIAGNOSIS — I700 Atherosclerosis of aorta: Secondary | ICD-10-CM | POA: Diagnosis not present

## 2020-09-03 ENCOUNTER — Other Ambulatory Visit: Payer: Self-pay

## 2020-09-03 ENCOUNTER — Other Ambulatory Visit
Admission: RE | Admit: 2020-09-03 | Discharge: 2020-09-03 | Disposition: A | Discharge status: Home / Self Care | Payer: PPO | Source: Ambulatory Visit | Attending: General Surgery | Admitting: General Surgery

## 2020-09-03 DIAGNOSIS — I10 Essential (primary) hypertension: Secondary | ICD-10-CM | POA: Diagnosis not present

## 2020-09-03 DIAGNOSIS — E7849 Other hyperlipidemia: Secondary | ICD-10-CM | POA: Diagnosis not present

## 2020-09-03 DIAGNOSIS — G4733 Obstructive sleep apnea (adult) (pediatric): Secondary | ICD-10-CM | POA: Diagnosis not present

## 2020-09-03 DIAGNOSIS — I700 Atherosclerosis of aorta: Secondary | ICD-10-CM | POA: Diagnosis not present

## 2020-09-03 DIAGNOSIS — M5136 Other intervertebral disc degeneration, lumbar region: Secondary | ICD-10-CM | POA: Diagnosis not present

## 2020-09-03 DIAGNOSIS — Z20822 Contact with and (suspected) exposure to covid-19: Secondary | ICD-10-CM | POA: Diagnosis not present

## 2020-09-03 DIAGNOSIS — J4520 Mild intermittent asthma, uncomplicated: Secondary | ICD-10-CM | POA: Diagnosis not present

## 2020-09-03 DIAGNOSIS — F325 Major depressive disorder, single episode, in full remission: Secondary | ICD-10-CM | POA: Diagnosis not present

## 2020-09-03 DIAGNOSIS — M5417 Radiculopathy, lumbosacral region: Secondary | ICD-10-CM | POA: Diagnosis not present

## 2020-09-03 DIAGNOSIS — Z01812 Encounter for preprocedural laboratory examination: Secondary | ICD-10-CM | POA: Diagnosis not present

## 2020-09-03 DIAGNOSIS — E034 Atrophy of thyroid (acquired): Secondary | ICD-10-CM | POA: Diagnosis not present

## 2020-09-03 DIAGNOSIS — E538 Deficiency of other specified B group vitamins: Secondary | ICD-10-CM | POA: Diagnosis not present

## 2020-09-04 ENCOUNTER — Encounter: Payer: Self-pay | Admitting: General Surgery

## 2020-09-04 LAB — SARS CORONAVIRUS 2 (TAT 6-24 HRS): SARS Coronavirus 2: NEGATIVE

## 2020-09-05 ENCOUNTER — Other Ambulatory Visit: Payer: Self-pay

## 2020-09-05 ENCOUNTER — Encounter
Admission: RE | Disposition: A | Discharge status: Home / Self Care | Payer: Self-pay | Source: Home / Self Care | Attending: General Surgery

## 2020-09-05 ENCOUNTER — Ambulatory Visit: Payer: PPO | Admitting: Certified Registered"

## 2020-09-05 ENCOUNTER — Encounter: Payer: Self-pay | Admitting: General Surgery

## 2020-09-05 ENCOUNTER — Ambulatory Visit
Admission: RE | Admit: 2020-09-05 | Discharge: 2020-09-05 | Disposition: A | Discharge status: Home / Self Care | Payer: PPO | Attending: General Surgery | Admitting: General Surgery

## 2020-09-05 DIAGNOSIS — K5730 Diverticulosis of large intestine without perforation or abscess without bleeding: Secondary | ICD-10-CM | POA: Insufficient documentation

## 2020-09-05 DIAGNOSIS — Z86010 Personal history of colonic polyps: Secondary | ICD-10-CM | POA: Insufficient documentation

## 2020-09-05 DIAGNOSIS — K635 Polyp of colon: Secondary | ICD-10-CM | POA: Diagnosis not present

## 2020-09-05 DIAGNOSIS — K5790 Diverticulosis of intestine, part unspecified, without perforation or abscess without bleeding: Secondary | ICD-10-CM | POA: Diagnosis not present

## 2020-09-05 DIAGNOSIS — D120 Benign neoplasm of cecum: Secondary | ICD-10-CM | POA: Diagnosis not present

## 2020-09-05 DIAGNOSIS — Z1211 Encounter for screening for malignant neoplasm of colon: Secondary | ICD-10-CM | POA: Diagnosis not present

## 2020-09-05 DIAGNOSIS — Z79899 Other long term (current) drug therapy: Secondary | ICD-10-CM | POA: Diagnosis not present

## 2020-09-05 DIAGNOSIS — Z7982 Long term (current) use of aspirin: Secondary | ICD-10-CM | POA: Insufficient documentation

## 2020-09-05 DIAGNOSIS — Z79890 Hormone replacement therapy (postmenopausal): Secondary | ICD-10-CM | POA: Insufficient documentation

## 2020-09-05 HISTORY — PX: COLONOSCOPY WITH PROPOFOL: SHX5780

## 2020-09-05 HISTORY — DX: Personal history of other diseases of the musculoskeletal system and connective tissue: Z87.39

## 2020-09-05 HISTORY — DX: Atherosclerosis of aorta: I70.0

## 2020-09-05 HISTORY — DX: Gastro-esophageal reflux disease without esophagitis: K21.9

## 2020-09-05 SURGERY — COLONOSCOPY WITH PROPOFOL
Anesthesia: General

## 2020-09-05 MED ORDER — PROPOFOL 10 MG/ML IV BOLUS
INTRAVENOUS | Status: DC | PRN
  Administered 2020-09-05: 70 mg via INTRAVENOUS

## 2020-09-05 MED ORDER — SODIUM CHLORIDE 0.9 % IV SOLN: INTRAVENOUS | Status: DC

## 2020-09-05 MED ORDER — LIDOCAINE 2% (20 MG/ML) 5 ML SYRINGE
INTRAMUSCULAR | Status: DC | PRN
  Administered 2020-09-05: 25 mg via INTRAVENOUS

## 2020-09-05 MED ORDER — PROPOFOL 500 MG/50ML IV EMUL
INTRAVENOUS | Status: DC | PRN
  Administered 2020-09-05: 120 ug/kg/min via INTRAVENOUS

## 2020-09-05 MED ORDER — EPHEDRINE SULFATE 50 MG/ML IJ SOLN
INTRAMUSCULAR | Status: DC | PRN
  Administered 2020-09-05 (×2): 10 mg via INTRAVENOUS

## 2020-09-05 NOTE — H&P (Signed)
Bradley Holland [id number] 14-Oct-1945     HPI:  75 y/o male with a history of multiple adenomatous polyps from 2009-2012.  None noted on 2016 exam.  For repeat colonoscopy. He tolerated the prep well.   Medications Prior to Admission  Medication Sig Dispense Refill Last Dose  . aspirin 81 MG tablet Take 81 mg by mouth daily.   09/04/2020 at Unknown time  . azelastine (ASTELIN) 0.1 % nasal spray Place 1 spray into both nostrils 2 (two) times daily. Use in each nostril as directed     . fluticasone (FLONASE) 50 MCG/ACT nasal spray Place 2 sprays into both nostrils daily.     Marland Kitchen levothyroxine (SYNTHROID) 112 MCG tablet PLEASE SEE ATTACHED FOR DETAILED DIRECTIONS   09/05/2020 at Unknown time  . lovastatin (MEVACOR) 40 MG tablet TAKE 1 TABLET EVERY DAY   09/04/2020 at Unknown time  . sertraline (ZOLOFT) 100 MG tablet 50 mg daily as needed.    09/04/2020 at Unknown time  . tadalafil (CIALIS) 5 MG tablet Take 5 mg by mouth daily as needed for erectile dysfunction.     . TRIAMTERENE-HCTZ PO Take 1 tablet by mouth daily.   09/04/2020 at Unknown time  . ALPRAZolam (XANAX) 0.25 MG tablet TAKE 1 TABLET TWICE A DAY AS NEEDED FOR SLEEP  5   . B Complex Vitamins (B-COMPLEX/B-12 PO) Take 1 tablet by mouth daily.     . tamsulosin (FLOMAX) 0.4 MG CAPS capsule as needed.  (Patient not taking: Reported on 09/05/2020)  11 Not Taking at Unknown time  . traMADol (ULTRAM) 50 MG tablet Take 50 mg by mouth as needed.       Allergies  Allergen Reactions  . Ambien [Zolpidem Tartrate] Other (See Comments)    "crazy"  . Simvastatin Other (See Comments)  . Sulfa Antibiotics Rash   Past Medical History:  Diagnosis Date  . Aortic atherosclerosis ([place])   . Arthritis   . Asthma   . 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 GERD (gastroesophageal reflux disease)   . Hearing loss   . Hx of degenerative disc disease   . Hypertension   . Hypothyroidism   . Prostate enlargement   . Radiculitis, thoracic 12/18/2019   . Shingles Jan 2013  . Sleep apnea    Past Surgical History:  Procedure Laterality Date  . BACK SURGERY    . COLONOSCOPY  09/14/2011   Dr. Gustavo Lah  . COLONOSCOPY WITH PROPOFOL N/A 07/09/2015   Procedure: COLONOSCOPY WITH PROPOFOL;  Surgeon: [name], MD;  Location: [facility] ENDOSCOPY;  Service: Endoscopy;  Laterality: N/A;  . NECK SURGERY    . PALATE SURGERY     Social History   Socioeconomic History  . Marital status: Married    Spouse name: Not on file  . Number of children: Not on file  . Years of education: Not on file  . Highest education level: Not on file  Occupational History  . Not on file  Tobacco Use  . Smoking status: Never Smoker  . Smokeless tobacco: Never Used  Substance and Sexual Activity  . Alcohol use: No  . Drug use: No  . Sexual activity: Not on file  Other Topics Concern  . Not on file  Social History Narrative  . Not on file   Social Determinants of Health   Financial Resource Strain: Not on file  Food Insecurity: Not on file  Transportation Needs: Not on file  Physical Activity: Not on file  Stress:  Not on file  Social Connections: Not on file  Intimate Partner Violence: Not on file   Social History   Social History Narrative  . Not on file     ROS: Negative.     PE: HEENT: Negative. Lungs: Clear. Cardio: RR.  Assessment/Plan:  Proceed with planned endoscopy.   Forest Gleason [facility] 09/05/2020

## 2020-09-05 NOTE — Anesthesia Preprocedure Evaluation (Signed)
Anesthesia Evaluation  Patient identified by MRN, date of birth, ID band Patient awake    Reviewed: Allergy & Precautions, H&P , NPO status , Patient's Chart, lab work & pertinent test results, reviewed documented beta blocker date and time   History of Anesthesia Complications Negative for: history of anesthetic complications  Airway Mallampati: II  TM Distance: >3 FB Neck ROM: full    Dental  (+) Dental Advidsory Given, Teeth Intact, Missing   Pulmonary neg shortness of breath, asthma , sleep apnea , neg COPD, neg recent URI,    Pulmonary exam normal breath sounds clear to auscultation       Cardiovascular Exercise Tolerance: Good hypertension, (-) angina(-) Past MI and (-) Cardiac Stents Normal cardiovascular exam(-) dysrhythmias (-) Valvular Problems/Murmurs Rhythm:regular Rate:Normal     Neuro/Psych negative neurological ROS  negative psych ROS   GI/Hepatic Neg liver ROS, GERD  ,  Endo/Other  neg diabetesHypothyroidism   Renal/GU negative Renal ROS  negative genitourinary   Musculoskeletal   Abdominal   Peds  Hematology  (+) Blood dyscrasia, anemia ,   Anesthesia Other Findings Past Medical History: No date: Aortic atherosclerosis ([facility]) No date: Arthritis No date: Asthma 12/12/2019: DDD (degenerative disc disease), thoracic No date: GERD (gastroesophageal reflux disease) No date: Hearing loss No date: Hx of degenerative disc disease No date: Hypertension No date: Hypothyroidism No date: Prostate enlargement 12/18/2019: Radiculitis, thoracic Jan 2013: Shingles No date: Sleep apnea   Reproductive/Obstetrics negative OB ROS                             Anesthesia Physical Anesthesia Plan  ASA: II  Anesthesia Plan: General   Post-op Pain Management:    Induction: Intravenous  PONV Risk Score and Plan: 2 and TIVA and Propofol infusion  Airway Management Planned: Natural  Airway and Nasal Cannula  Additional Equipment:   Intra-op Plan:   Post-operative Plan:   Informed Consent: I have reviewed the patients History and Physical, chart, labs and discussed the procedure including the risks, benefits and alternatives for the proposed anesthesia with the patient or authorized representative who has indicated his/her understanding and acceptance.     Dental Advisory Given  Plan Discussed with: Anesthesiologist, CRNA and Surgeon  Anesthesia Plan Comments:         Anesthesia Quick Evaluation

## 2020-09-05 NOTE — Transfer of Care (Signed)
Immediate Anesthesia Transfer of Care Note  Patient: Bradley Holland  Procedure(s) Performed: COLONOSCOPY WITH PROPOFOL (N/A )  Patient Location: Endoscopy Unit  Anesthesia Type:General  Level of Consciousness: awake  Airway & Oxygen Therapy: Patient Spontanous Breathing and Patient connected to nasal cannula oxygen  Post-op Assessment: Report given to RN and Post -op Vital signs reviewed and stable  Post vital signs: Reviewed  Last Vitals:  Vitals Value Taken Time  BP 87/65 09/05/20 0851  Temp 36.3 C 09/05/20 0847  Pulse 98 09/05/20 0852  Resp 17 09/05/20 0852  SpO2 98 % 09/05/20 0852  Vitals shown include unvalidated device data.  Last Pain:  Vitals:   09/05/20 0847  TempSrc: Temporal  PainSc: Asleep         Complications: No complications documented.

## 2020-09-05 NOTE — Anesthesia Postprocedure Evaluation (Signed)
Anesthesia Post Note  Patient: Marquette Saa  Procedure(s) Performed: COLONOSCOPY WITH PROPOFOL (N/A )  Patient location during evaluation: Endoscopy Anesthesia Type: General Level of consciousness: awake and alert Pain management: pain level controlled Vital Signs Assessment: post-procedure vital signs reviewed and stable Respiratory status: spontaneous breathing, nonlabored ventilation, respiratory function stable and patient connected to nasal cannula oxygen Cardiovascular status: blood pressure returned to baseline and stable Postop Assessment: no apparent nausea or vomiting Anesthetic complications: no   No complications documented.   Last Vitals:  Vitals:   09/05/20 0857 09/05/20 0907  BP: 108/64 129/81  Pulse:    Resp:    Temp:    SpO2:      Last Pain:  Vitals:   09/05/20 0917  TempSrc:   PainSc: 0-No pain                 Martha Clan

## 2020-09-05 NOTE — Op Note (Signed)
[facility] Patient Name: Bradley Holland Procedure Date: 09/05/2020 8:15 AM MRN: [id number] Account #: [id number] Date of Birth: Jan 16, 1946 Admit Type: Outpatient Age: 75 Room: [facility] ENDO ROOM 1 Gender: Male Note Status: Finalized Procedure:             Colonoscopy Indications:           High risk colon cancer surveillance: Personal history                         of colonic polyps Providers:             [name], MD Referring MD:          Ramonita Lab, MD (Referring MD) Medicines:             Monitored Anesthesia Care Complications:         No immediate complications. Procedure:             Pre-Anesthesia Assessment:                        - Prior to the procedure, a History and Physical was                         performed, and patient medications, allergies and                         sensitivities were reviewed. The patient's tolerance                         of previous anesthesia was reviewed.                        - The risks and benefits of the procedure and the                         sedation options and risks were discussed with the                         patient. All questions were answered and informed                         consent was obtained.                        After obtaining informed consent, the colonoscope was                         passed under direct vision. Throughout the procedure,                         the patient's blood pressure, pulse, and oxygen                         saturations were monitored continuously. The                         Colonoscope was introduced through the anus and                         advanced to the the cecum, identified  by appendiceal                         orifice and ileocecal valve. The colonoscopy was                         performed without difficulty. The patient tolerated                         the procedure well. The quality of the bowel                          preparation was excellent. Findings:      A 5 mm polyp was found in the cecum. The polyp was sessile. Biopsies       were taken with a cold forceps for histology.      The retroflexed view of the distal rectum and anal verge was normal and       showed no anal or rectal abnormalities.      Many medium-mouthed diverticula were found in the recto-sigmoid colon       and sigmoid colon. Impression:            - One 5 mm polyp in the cecum. Biopsied.                        - The distal rectum and anal verge are normal on                         retroflexion view.                        - Diverticulosis in the recto-sigmoid colon and in the                         sigmoid colon. Recommendation:        - Telephone endoscopist for pathology results in 1                         week.                        - High fiber diet indefinitely.                        - Repeat colonoscopy in 5 years for surveillance. Procedure Code(s):     --- Professional ---                        [phone], Colonoscopy, flexible; with biopsy, single or                         multiple Diagnosis Code(s):     --- Professional ---                        Z86.010, Personal history of colonic polyps                        K63.5, Polyp of colon  K57.30, Diverticulosis of large intestine without                         perforation or abscess without bleeding CPT copyright 2019 [employer]. All rights reserved. The codes documented in this report are preliminary and upon coder review may  be revised to meet current compliance requirements. [name], MD 09/05/2020 8:44:48 AM This report has been signed electronically. Number of Addenda: 0 Note Initiated On: 09/05/2020 8:15 AM Scope Withdrawal Time: 0 hours 8 minutes 35 seconds  Total Procedure Duration: 0 hours 12 minutes 7 seconds       [facility]

## 2020-09-08 DIAGNOSIS — E538 Deficiency of other specified B group vitamins: Secondary | ICD-10-CM | POA: Diagnosis not present

## 2020-09-08 LAB — SURGICAL PATHOLOGY

## 2020-09-19 DIAGNOSIS — I10 Essential (primary) hypertension: Secondary | ICD-10-CM | POA: Diagnosis not present

## 2020-09-29 DIAGNOSIS — J4520 Mild intermittent asthma, uncomplicated: Secondary | ICD-10-CM | POA: Diagnosis not present

## 2020-09-29 DIAGNOSIS — R42 Dizziness and giddiness: Secondary | ICD-10-CM | POA: Diagnosis not present

## 2020-09-29 DIAGNOSIS — I10 Essential (primary) hypertension: Secondary | ICD-10-CM | POA: Diagnosis not present

## 2020-10-09 DIAGNOSIS — E538 Deficiency of other specified B group vitamins: Secondary | ICD-10-CM | POA: Diagnosis not present

## 2020-10-13 DIAGNOSIS — R059 Cough, unspecified: Secondary | ICD-10-CM | POA: Diagnosis not present

## 2020-10-13 DIAGNOSIS — R053 Chronic cough: Secondary | ICD-10-CM | POA: Diagnosis not present

## 2020-10-13 DIAGNOSIS — J310 Chronic rhinitis: Secondary | ICD-10-CM | POA: Diagnosis not present

## 2020-10-13 DIAGNOSIS — R0602 Shortness of breath: Secondary | ICD-10-CM | POA: Diagnosis not present

## 2020-10-13 DIAGNOSIS — Z87898 Personal history of other specified conditions: Secondary | ICD-10-CM | POA: Diagnosis not present

## 2020-11-10 DIAGNOSIS — E538 Deficiency of other specified B group vitamins: Secondary | ICD-10-CM | POA: Diagnosis not present

## 2020-11-18 DIAGNOSIS — R55 Syncope and collapse: Secondary | ICD-10-CM | POA: Diagnosis not present

## 2020-11-27 DIAGNOSIS — E538 Deficiency of other specified B group vitamins: Secondary | ICD-10-CM | POA: Diagnosis not present

## 2020-11-27 DIAGNOSIS — I10 Essential (primary) hypertension: Secondary | ICD-10-CM | POA: Diagnosis not present

## 2020-11-27 DIAGNOSIS — E7849 Other hyperlipidemia: Secondary | ICD-10-CM | POA: Diagnosis not present

## 2020-12-03 DIAGNOSIS — F325 Major depressive disorder, single episode, in full remission: Secondary | ICD-10-CM | POA: Diagnosis not present

## 2020-12-03 DIAGNOSIS — E034 Atrophy of thyroid (acquired): Secondary | ICD-10-CM | POA: Diagnosis not present

## 2020-12-03 DIAGNOSIS — J4520 Mild intermittent asthma, uncomplicated: Secondary | ICD-10-CM | POA: Diagnosis not present

## 2020-12-03 DIAGNOSIS — G4733 Obstructive sleep apnea (adult) (pediatric): Secondary | ICD-10-CM | POA: Diagnosis not present

## 2020-12-03 DIAGNOSIS — E7849 Other hyperlipidemia: Secondary | ICD-10-CM | POA: Diagnosis not present

## 2020-12-03 DIAGNOSIS — E538 Deficiency of other specified B group vitamins: Secondary | ICD-10-CM | POA: Diagnosis not present

## 2020-12-03 DIAGNOSIS — M47816 Spondylosis without myelopathy or radiculopathy, lumbar region: Secondary | ICD-10-CM | POA: Diagnosis not present

## 2020-12-03 DIAGNOSIS — I700 Atherosclerosis of aorta: Secondary | ICD-10-CM | POA: Diagnosis not present

## 2020-12-03 DIAGNOSIS — I10 Essential (primary) hypertension: Secondary | ICD-10-CM | POA: Diagnosis not present

## 2020-12-03 DIAGNOSIS — Z125 Encounter for screening for malignant neoplasm of prostate: Secondary | ICD-10-CM | POA: Diagnosis not present

## 2020-12-11 DIAGNOSIS — E538 Deficiency of other specified B group vitamins: Secondary | ICD-10-CM | POA: Diagnosis not present

## 2021-01-12 DIAGNOSIS — E538 Deficiency of other specified B group vitamins: Secondary | ICD-10-CM | POA: Diagnosis not present

## 2021-02-24 DIAGNOSIS — E538 Deficiency of other specified B group vitamins: Secondary | ICD-10-CM | POA: Diagnosis not present

## 2021-03-27 DIAGNOSIS — E538 Deficiency of other specified B group vitamins: Secondary | ICD-10-CM | POA: Diagnosis not present

## 2021-04-07 DIAGNOSIS — E7849 Other hyperlipidemia: Secondary | ICD-10-CM | POA: Diagnosis not present

## 2021-04-07 DIAGNOSIS — I10 Essential (primary) hypertension: Secondary | ICD-10-CM | POA: Diagnosis not present

## 2021-04-07 DIAGNOSIS — E538 Deficiency of other specified B group vitamins: Secondary | ICD-10-CM | POA: Diagnosis not present

## 2021-04-07 DIAGNOSIS — Z125 Encounter for screening for malignant neoplasm of prostate: Secondary | ICD-10-CM | POA: Diagnosis not present

## 2021-04-07 DIAGNOSIS — E034 Atrophy of thyroid (acquired): Secondary | ICD-10-CM | POA: Diagnosis not present

## 2021-04-14 DIAGNOSIS — E538 Deficiency of other specified B group vitamins: Secondary | ICD-10-CM | POA: Diagnosis not present

## 2021-04-14 DIAGNOSIS — G4733 Obstructive sleep apnea (adult) (pediatric): Secondary | ICD-10-CM | POA: Diagnosis not present

## 2021-04-14 DIAGNOSIS — E7849 Other hyperlipidemia: Secondary | ICD-10-CM | POA: Diagnosis not present

## 2021-04-14 DIAGNOSIS — I10 Essential (primary) hypertension: Secondary | ICD-10-CM | POA: Diagnosis not present

## 2021-04-14 DIAGNOSIS — M5136 Other intervertebral disc degeneration, lumbar region: Secondary | ICD-10-CM | POA: Diagnosis not present

## 2021-04-14 DIAGNOSIS — I700 Atherosclerosis of aorta: Secondary | ICD-10-CM | POA: Diagnosis not present

## 2021-04-14 DIAGNOSIS — Z0000 Encounter for general adult medical examination without abnormal findings: Secondary | ICD-10-CM | POA: Diagnosis not present

## 2021-04-14 DIAGNOSIS — E034 Atrophy of thyroid (acquired): Secondary | ICD-10-CM | POA: Diagnosis not present

## 2021-04-14 DIAGNOSIS — F325 Major depressive disorder, single episode, in full remission: Secondary | ICD-10-CM | POA: Diagnosis not present

## 2021-04-14 DIAGNOSIS — J4520 Mild intermittent asthma, uncomplicated: Secondary | ICD-10-CM | POA: Diagnosis not present

## 2021-04-27 DIAGNOSIS — E538 Deficiency of other specified B group vitamins: Secondary | ICD-10-CM | POA: Diagnosis not present

## 2021-05-12 DIAGNOSIS — I700 Atherosclerosis of aorta: Secondary | ICD-10-CM | POA: Diagnosis not present

## 2021-05-12 DIAGNOSIS — E034 Atrophy of thyroid (acquired): Secondary | ICD-10-CM | POA: Diagnosis not present

## 2021-05-12 DIAGNOSIS — Z79891 Long term (current) use of opiate analgesic: Secondary | ICD-10-CM | POA: Diagnosis not present

## 2021-05-12 DIAGNOSIS — I10 Essential (primary) hypertension: Secondary | ICD-10-CM | POA: Diagnosis not present

## 2021-05-12 DIAGNOSIS — E7849 Other hyperlipidemia: Secondary | ICD-10-CM | POA: Diagnosis not present

## 2021-05-12 DIAGNOSIS — M5136 Other intervertebral disc degeneration, lumbar region: Secondary | ICD-10-CM | POA: Diagnosis not present

## 2021-05-12 DIAGNOSIS — F325 Major depressive disorder, single episode, in full remission: Secondary | ICD-10-CM | POA: Diagnosis not present

## 2021-05-12 DIAGNOSIS — G4733 Obstructive sleep apnea (adult) (pediatric): Secondary | ICD-10-CM | POA: Diagnosis not present

## 2021-05-12 DIAGNOSIS — Z23 Encounter for immunization: Secondary | ICD-10-CM | POA: Diagnosis not present

## 2021-05-12 DIAGNOSIS — E538 Deficiency of other specified B group vitamins: Secondary | ICD-10-CM | POA: Diagnosis not present

## 2021-05-28 DIAGNOSIS — E538 Deficiency of other specified B group vitamins: Secondary | ICD-10-CM | POA: Diagnosis not present

## 2021-06-03 DIAGNOSIS — M9901 Segmental and somatic dysfunction of cervical region: Secondary | ICD-10-CM | POA: Diagnosis not present

## 2021-06-03 DIAGNOSIS — M530 Cervicocranial syndrome: Secondary | ICD-10-CM | POA: Diagnosis not present

## 2021-06-29 DIAGNOSIS — E538 Deficiency of other specified B group vitamins: Secondary | ICD-10-CM | POA: Diagnosis not present

## 2021-07-30 DIAGNOSIS — E538 Deficiency of other specified B group vitamins: Secondary | ICD-10-CM | POA: Diagnosis not present

## 2021-09-01 DIAGNOSIS — E538 Deficiency of other specified B group vitamins: Secondary | ICD-10-CM | POA: Diagnosis not present

## 2021-09-29 DIAGNOSIS — H2513 Age-related nuclear cataract, bilateral: Secondary | ICD-10-CM | POA: Diagnosis not present

## 2021-10-08 DIAGNOSIS — E034 Atrophy of thyroid (acquired): Secondary | ICD-10-CM | POA: Diagnosis not present

## 2021-10-08 DIAGNOSIS — E538 Deficiency of other specified B group vitamins: Secondary | ICD-10-CM | POA: Diagnosis not present

## 2021-10-08 DIAGNOSIS — Z79891 Long term (current) use of opiate analgesic: Secondary | ICD-10-CM | POA: Diagnosis not present

## 2021-10-08 DIAGNOSIS — I10 Essential (primary) hypertension: Secondary | ICD-10-CM | POA: Diagnosis not present

## 2021-10-08 DIAGNOSIS — E7849 Other hyperlipidemia: Secondary | ICD-10-CM | POA: Diagnosis not present

## 2021-10-16 DIAGNOSIS — M5417 Radiculopathy, lumbosacral region: Secondary | ICD-10-CM | POA: Diagnosis not present

## 2021-10-16 DIAGNOSIS — E538 Deficiency of other specified B group vitamins: Secondary | ICD-10-CM | POA: Diagnosis not present

## 2021-10-16 DIAGNOSIS — M5136 Other intervertebral disc degeneration, lumbar region: Secondary | ICD-10-CM | POA: Diagnosis not present

## 2021-10-16 DIAGNOSIS — I10 Essential (primary) hypertension: Secondary | ICD-10-CM | POA: Diagnosis not present

## 2021-10-16 DIAGNOSIS — F325 Major depressive disorder, single episode, in full remission: Secondary | ICD-10-CM | POA: Diagnosis not present

## 2021-10-16 DIAGNOSIS — E034 Atrophy of thyroid (acquired): Secondary | ICD-10-CM | POA: Diagnosis not present

## 2021-10-16 DIAGNOSIS — G4733 Obstructive sleep apnea (adult) (pediatric): Secondary | ICD-10-CM | POA: Diagnosis not present

## 2021-10-16 DIAGNOSIS — Z79891 Long term (current) use of opiate analgesic: Secondary | ICD-10-CM | POA: Diagnosis not present

## 2021-10-16 DIAGNOSIS — E7849 Other hyperlipidemia: Secondary | ICD-10-CM | POA: Diagnosis not present

## 2021-10-16 DIAGNOSIS — I700 Atherosclerosis of aorta: Secondary | ICD-10-CM | POA: Diagnosis not present

## 2021-11-05 DIAGNOSIS — E034 Atrophy of thyroid (acquired): Secondary | ICD-10-CM | POA: Diagnosis not present

## 2021-11-16 DIAGNOSIS — E538 Deficiency of other specified B group vitamins: Secondary | ICD-10-CM | POA: Diagnosis not present

## 2021-12-17 DIAGNOSIS — E538 Deficiency of other specified B group vitamins: Secondary | ICD-10-CM | POA: Diagnosis not present

## 2022-01-18 DIAGNOSIS — E538 Deficiency of other specified B group vitamins: Secondary | ICD-10-CM | POA: Diagnosis not present

## 2022-01-21 DIAGNOSIS — I10 Essential (primary) hypertension: Secondary | ICD-10-CM | POA: Diagnosis not present

## 2022-01-21 DIAGNOSIS — I700 Atherosclerosis of aorta: Secondary | ICD-10-CM | POA: Diagnosis not present

## 2022-01-21 DIAGNOSIS — J069 Acute upper respiratory infection, unspecified: Secondary | ICD-10-CM | POA: Diagnosis not present

## 2022-01-21 DIAGNOSIS — E538 Deficiency of other specified B group vitamins: Secondary | ICD-10-CM | POA: Diagnosis not present

## 2022-01-21 DIAGNOSIS — F325 Major depressive disorder, single episode, in full remission: Secondary | ICD-10-CM | POA: Diagnosis not present

## 2022-01-21 DIAGNOSIS — Z79891 Long term (current) use of opiate analgesic: Secondary | ICD-10-CM | POA: Diagnosis not present

## 2022-02-18 DIAGNOSIS — E538 Deficiency of other specified B group vitamins: Secondary | ICD-10-CM | POA: Diagnosis not present

## 2022-02-22 DIAGNOSIS — M530 Cervicocranial syndrome: Secondary | ICD-10-CM | POA: Diagnosis not present

## 2022-02-22 DIAGNOSIS — M9901 Segmental and somatic dysfunction of cervical region: Secondary | ICD-10-CM | POA: Diagnosis not present

## 2022-03-03 DIAGNOSIS — M62838 Other muscle spasm: Secondary | ICD-10-CM | POA: Diagnosis not present

## 2022-03-03 DIAGNOSIS — M9902 Segmental and somatic dysfunction of thoracic region: Secondary | ICD-10-CM | POA: Diagnosis not present

## 2022-03-03 DIAGNOSIS — M9903 Segmental and somatic dysfunction of lumbar region: Secondary | ICD-10-CM | POA: Diagnosis not present

## 2022-03-03 DIAGNOSIS — M5412 Radiculopathy, cervical region: Secondary | ICD-10-CM | POA: Diagnosis not present

## 2022-03-03 DIAGNOSIS — M9901 Segmental and somatic dysfunction of cervical region: Secondary | ICD-10-CM | POA: Diagnosis not present

## 2022-03-03 DIAGNOSIS — M5416 Radiculopathy, lumbar region: Secondary | ICD-10-CM | POA: Diagnosis not present

## 2022-03-22 DIAGNOSIS — E538 Deficiency of other specified B group vitamins: Secondary | ICD-10-CM | POA: Diagnosis not present

## 2022-04-09 DIAGNOSIS — I10 Essential (primary) hypertension: Secondary | ICD-10-CM | POA: Diagnosis not present

## 2022-04-09 DIAGNOSIS — E034 Atrophy of thyroid (acquired): Secondary | ICD-10-CM | POA: Diagnosis not present

## 2022-04-09 DIAGNOSIS — Z79891 Long term (current) use of opiate analgesic: Secondary | ICD-10-CM | POA: Diagnosis not present

## 2022-04-09 DIAGNOSIS — E538 Deficiency of other specified B group vitamins: Secondary | ICD-10-CM | POA: Diagnosis not present

## 2022-04-09 DIAGNOSIS — E7849 Other hyperlipidemia: Secondary | ICD-10-CM | POA: Diagnosis not present

## 2022-04-16 DIAGNOSIS — E538 Deficiency of other specified B group vitamins: Secondary | ICD-10-CM | POA: Diagnosis not present

## 2022-04-16 DIAGNOSIS — I10 Essential (primary) hypertension: Secondary | ICD-10-CM | POA: Diagnosis not present

## 2022-04-16 DIAGNOSIS — F325 Major depressive disorder, single episode, in full remission: Secondary | ICD-10-CM | POA: Diagnosis not present

## 2022-04-16 DIAGNOSIS — Z0000 Encounter for general adult medical examination without abnormal findings: Secondary | ICD-10-CM | POA: Diagnosis not present

## 2022-04-16 DIAGNOSIS — I700 Atherosclerosis of aorta: Secondary | ICD-10-CM | POA: Diagnosis not present

## 2022-04-16 DIAGNOSIS — Z79891 Long term (current) use of opiate analgesic: Secondary | ICD-10-CM | POA: Diagnosis not present

## 2022-04-16 DIAGNOSIS — M5136 Other intervertebral disc degeneration, lumbar region: Secondary | ICD-10-CM | POA: Diagnosis not present

## 2022-04-16 DIAGNOSIS — E7849 Other hyperlipidemia: Secondary | ICD-10-CM | POA: Diagnosis not present

## 2022-04-16 DIAGNOSIS — E876 Hypokalemia: Secondary | ICD-10-CM | POA: Diagnosis not present

## 2022-04-16 DIAGNOSIS — G4733 Obstructive sleep apnea (adult) (pediatric): Secondary | ICD-10-CM | POA: Diagnosis not present

## 2022-04-16 DIAGNOSIS — E034 Atrophy of thyroid (acquired): Secondary | ICD-10-CM | POA: Diagnosis not present

## 2022-04-22 DIAGNOSIS — E538 Deficiency of other specified B group vitamins: Secondary | ICD-10-CM | POA: Diagnosis not present

## 2022-04-22 DIAGNOSIS — E876 Hypokalemia: Secondary | ICD-10-CM | POA: Diagnosis not present

## 2022-05-19 DIAGNOSIS — G4733 Obstructive sleep apnea (adult) (pediatric): Secondary | ICD-10-CM | POA: Diagnosis not present

## 2022-05-22 DIAGNOSIS — G4733 Obstructive sleep apnea (adult) (pediatric): Secondary | ICD-10-CM | POA: Diagnosis not present

## 2022-05-24 DIAGNOSIS — E538 Deficiency of other specified B group vitamins: Secondary | ICD-10-CM | POA: Diagnosis not present

## 2022-06-24 DIAGNOSIS — E538 Deficiency of other specified B group vitamins: Secondary | ICD-10-CM | POA: Diagnosis not present

## 2022-07-01 DIAGNOSIS — G5761 Lesion of plantar nerve, right lower limb: Secondary | ICD-10-CM | POA: Diagnosis not present

## 2022-07-01 DIAGNOSIS — M79671 Pain in right foot: Secondary | ICD-10-CM | POA: Diagnosis not present

## 2022-07-01 DIAGNOSIS — I10 Essential (primary) hypertension: Secondary | ICD-10-CM | POA: Diagnosis not present

## 2022-07-27 DIAGNOSIS — E538 Deficiency of other specified B group vitamins: Secondary | ICD-10-CM | POA: Diagnosis not present

## 2022-08-16 DIAGNOSIS — M47812 Spondylosis without myelopathy or radiculopathy, cervical region: Secondary | ICD-10-CM | POA: Diagnosis not present

## 2022-08-16 DIAGNOSIS — M5030 Other cervical disc degeneration, unspecified cervical region: Secondary | ICD-10-CM | POA: Diagnosis not present

## 2022-08-16 DIAGNOSIS — M5416 Radiculopathy, lumbar region: Secondary | ICD-10-CM | POA: Diagnosis not present

## 2022-08-16 DIAGNOSIS — M48062 Spinal stenosis, lumbar region with neurogenic claudication: Secondary | ICD-10-CM | POA: Diagnosis not present

## 2022-09-01 DIAGNOSIS — E538 Deficiency of other specified B group vitamins: Secondary | ICD-10-CM | POA: Diagnosis not present

## 2022-10-04 DIAGNOSIS — E538 Deficiency of other specified B group vitamins: Secondary | ICD-10-CM | POA: Diagnosis not present

## 2022-10-12 DIAGNOSIS — E538 Deficiency of other specified B group vitamins: Secondary | ICD-10-CM | POA: Diagnosis not present

## 2022-10-12 DIAGNOSIS — Z79891 Long term (current) use of opiate analgesic: Secondary | ICD-10-CM | POA: Diagnosis not present

## 2022-10-12 DIAGNOSIS — I10 Essential (primary) hypertension: Secondary | ICD-10-CM | POA: Diagnosis not present

## 2022-10-12 DIAGNOSIS — E7849 Other hyperlipidemia: Secondary | ICD-10-CM | POA: Diagnosis not present

## 2022-10-19 DIAGNOSIS — F325 Major depressive disorder, single episode, in full remission: Secondary | ICD-10-CM | POA: Diagnosis not present

## 2022-10-19 DIAGNOSIS — G4733 Obstructive sleep apnea (adult) (pediatric): Secondary | ICD-10-CM | POA: Diagnosis not present

## 2022-10-19 DIAGNOSIS — E538 Deficiency of other specified B group vitamins: Secondary | ICD-10-CM | POA: Diagnosis not present

## 2022-10-19 DIAGNOSIS — M5136 Other intervertebral disc degeneration, lumbar region: Secondary | ICD-10-CM | POA: Diagnosis not present

## 2022-10-19 DIAGNOSIS — Z2821 Immunization not carried out because of patient refusal: Secondary | ICD-10-CM | POA: Diagnosis not present

## 2022-10-19 DIAGNOSIS — H9191 Unspecified hearing loss, right ear: Secondary | ICD-10-CM | POA: Diagnosis not present

## 2022-10-19 DIAGNOSIS — Z79891 Long term (current) use of opiate analgesic: Secondary | ICD-10-CM | POA: Diagnosis not present

## 2022-10-19 DIAGNOSIS — R42 Dizziness and giddiness: Secondary | ICD-10-CM | POA: Diagnosis not present

## 2022-10-19 DIAGNOSIS — E034 Atrophy of thyroid (acquired): Secondary | ICD-10-CM | POA: Diagnosis not present

## 2022-10-19 DIAGNOSIS — E7849 Other hyperlipidemia: Secondary | ICD-10-CM | POA: Diagnosis not present

## 2022-10-19 DIAGNOSIS — I700 Atherosclerosis of aorta: Secondary | ICD-10-CM | POA: Diagnosis not present

## 2022-10-19 DIAGNOSIS — I10 Essential (primary) hypertension: Secondary | ICD-10-CM | POA: Diagnosis not present

## 2022-10-26 DIAGNOSIS — H5203 Hypermetropia, bilateral: Secondary | ICD-10-CM | POA: Diagnosis not present

## 2022-11-16 DIAGNOSIS — H8103 Meniere's disease, bilateral: Secondary | ICD-10-CM | POA: Diagnosis not present

## 2022-11-16 DIAGNOSIS — H903 Sensorineural hearing loss, bilateral: Secondary | ICD-10-CM | POA: Diagnosis not present

## 2022-11-21 DIAGNOSIS — M545 Low back pain, unspecified: Secondary | ICD-10-CM | POA: Diagnosis not present

## 2022-11-22 DIAGNOSIS — I10 Essential (primary) hypertension: Secondary | ICD-10-CM | POA: Diagnosis not present

## 2022-11-22 DIAGNOSIS — E034 Atrophy of thyroid (acquired): Secondary | ICD-10-CM | POA: Diagnosis not present

## 2022-12-01 DIAGNOSIS — M4316 Spondylolisthesis, lumbar region: Secondary | ICD-10-CM | POA: Diagnosis not present

## 2022-12-01 DIAGNOSIS — F325 Major depressive disorder, single episode, in full remission: Secondary | ICD-10-CM | POA: Diagnosis not present

## 2022-12-01 DIAGNOSIS — J4520 Mild intermittent asthma, uncomplicated: Secondary | ICD-10-CM | POA: Diagnosis not present

## 2022-12-01 DIAGNOSIS — M5136 Other intervertebral disc degeneration, lumbar region: Secondary | ICD-10-CM | POA: Diagnosis not present

## 2022-12-01 DIAGNOSIS — I700 Atherosclerosis of aorta: Secondary | ICD-10-CM | POA: Diagnosis not present

## 2022-12-01 DIAGNOSIS — M545 Low back pain, unspecified: Secondary | ICD-10-CM | POA: Diagnosis not present

## 2022-12-01 DIAGNOSIS — I10 Essential (primary) hypertension: Secondary | ICD-10-CM | POA: Diagnosis not present

## 2022-12-01 DIAGNOSIS — Z2821 Immunization not carried out because of patient refusal: Secondary | ICD-10-CM | POA: Diagnosis not present

## 2022-12-06 DIAGNOSIS — E538 Deficiency of other specified B group vitamins: Secondary | ICD-10-CM | POA: Diagnosis not present

## 2022-12-06 DIAGNOSIS — E876 Hypokalemia: Secondary | ICD-10-CM | POA: Diagnosis not present

## 2022-12-07 ENCOUNTER — Other Ambulatory Visit: Payer: Self-pay | Admitting: Internal Medicine

## 2022-12-07 DIAGNOSIS — M545 Low back pain, unspecified: Secondary | ICD-10-CM

## 2022-12-09 DIAGNOSIS — E876 Hypokalemia: Secondary | ICD-10-CM | POA: Diagnosis not present

## 2022-12-13 ENCOUNTER — Ambulatory Visit
Admission: RE | Admit: 2022-12-13 | Discharge: 2022-12-13 | Disposition: A | Discharge status: Home / Self Care | Payer: PPO | Source: Ambulatory Visit | Attending: Internal Medicine | Admitting: Internal Medicine

## 2022-12-13 DIAGNOSIS — M4316 Spondylolisthesis, lumbar region: Secondary | ICD-10-CM | POA: Diagnosis not present

## 2022-12-13 DIAGNOSIS — M545 Low back pain, unspecified: Secondary | ICD-10-CM | POA: Insufficient documentation

## 2022-12-30 DIAGNOSIS — M5416 Radiculopathy, lumbar region: Secondary | ICD-10-CM | POA: Diagnosis not present

## 2022-12-30 DIAGNOSIS — M48062 Spinal stenosis, lumbar region with neurogenic claudication: Secondary | ICD-10-CM | POA: Diagnosis not present

## 2023-01-04 DIAGNOSIS — M5416 Radiculopathy, lumbar region: Secondary | ICD-10-CM | POA: Diagnosis not present

## 2023-01-04 DIAGNOSIS — M48062 Spinal stenosis, lumbar region with neurogenic claudication: Secondary | ICD-10-CM | POA: Diagnosis not present

## 2023-01-06 DIAGNOSIS — E538 Deficiency of other specified B group vitamins: Secondary | ICD-10-CM | POA: Diagnosis not present

## 2023-01-18 DIAGNOSIS — I700 Atherosclerosis of aorta: Secondary | ICD-10-CM | POA: Diagnosis not present

## 2023-01-18 DIAGNOSIS — R109 Unspecified abdominal pain: Secondary | ICD-10-CM | POA: Diagnosis not present

## 2023-01-18 DIAGNOSIS — J4520 Mild intermittent asthma, uncomplicated: Secondary | ICD-10-CM | POA: Diagnosis not present

## 2023-01-18 DIAGNOSIS — R1084 Generalized abdominal pain: Secondary | ICD-10-CM | POA: Diagnosis not present

## 2023-01-18 DIAGNOSIS — Z79891 Long term (current) use of opiate analgesic: Secondary | ICD-10-CM | POA: Diagnosis not present

## 2023-01-18 DIAGNOSIS — F325 Major depressive disorder, single episode, in full remission: Secondary | ICD-10-CM | POA: Diagnosis not present

## 2023-01-18 DIAGNOSIS — R1013 Epigastric pain: Secondary | ICD-10-CM | POA: Diagnosis not present

## 2023-01-18 DIAGNOSIS — I10 Essential (primary) hypertension: Secondary | ICD-10-CM | POA: Diagnosis not present

## 2023-01-19 ENCOUNTER — Other Ambulatory Visit: Payer: Self-pay

## 2023-01-19 DIAGNOSIS — R1084 Generalized abdominal pain: Secondary | ICD-10-CM

## 2023-01-21 ENCOUNTER — Inpatient Hospital Stay
Admission: EM | Admit: 2023-01-21 | Discharge: 2023-01-23 | DRG: 193 | Disposition: A | Discharge status: Home / Self Care | Payer: PPO | Attending: Student in an Organized Health Care Education/Training Program | Admitting: Student in an Organized Health Care Education/Training Program

## 2023-01-21 ENCOUNTER — Emergency Department: Payer: PPO

## 2023-01-21 ENCOUNTER — Other Ambulatory Visit: Payer: Self-pay

## 2023-01-21 DIAGNOSIS — Z79899 Other long term (current) drug therapy: Secondary | ICD-10-CM

## 2023-01-21 DIAGNOSIS — E039 Hypothyroidism, unspecified: Secondary | ICD-10-CM | POA: Diagnosis not present

## 2023-01-21 DIAGNOSIS — R0902 Hypoxemia: Secondary | ICD-10-CM | POA: Diagnosis not present

## 2023-01-21 DIAGNOSIS — E871 Hypo-osmolality and hyponatremia: Secondary | ICD-10-CM

## 2023-01-21 DIAGNOSIS — M5136 Other intervertebral disc degeneration, lumbar region: Secondary | ICD-10-CM | POA: Diagnosis present

## 2023-01-21 DIAGNOSIS — J209 Acute bronchitis, unspecified: Secondary | ICD-10-CM | POA: Diagnosis present

## 2023-01-21 DIAGNOSIS — E785 Hyperlipidemia, unspecified: Secondary | ICD-10-CM | POA: Diagnosis present

## 2023-01-21 DIAGNOSIS — K219 Gastro-esophageal reflux disease without esophagitis: Secondary | ICD-10-CM | POA: Diagnosis not present

## 2023-01-21 DIAGNOSIS — J069 Acute upper respiratory infection, unspecified: Secondary | ICD-10-CM | POA: Diagnosis not present

## 2023-01-21 DIAGNOSIS — Z79890 Hormone replacement therapy (postmenopausal): Secondary | ICD-10-CM

## 2023-01-21 DIAGNOSIS — M48061 Spinal stenosis, lumbar region without neurogenic claudication: Secondary | ICD-10-CM | POA: Diagnosis not present

## 2023-01-21 DIAGNOSIS — J9601 Acute respiratory failure with hypoxia: Secondary | ICD-10-CM | POA: Diagnosis present

## 2023-01-21 DIAGNOSIS — G8929 Other chronic pain: Secondary | ICD-10-CM | POA: Diagnosis present

## 2023-01-21 DIAGNOSIS — H9190 Unspecified hearing loss, unspecified ear: Secondary | ICD-10-CM | POA: Diagnosis present

## 2023-01-21 DIAGNOSIS — J208 Acute bronchitis due to other specified organisms: Secondary | ICD-10-CM | POA: Diagnosis present

## 2023-01-21 DIAGNOSIS — I10 Essential (primary) hypertension: Secondary | ICD-10-CM | POA: Diagnosis present

## 2023-01-21 DIAGNOSIS — R0602 Shortness of breath: Secondary | ICD-10-CM | POA: Diagnosis not present

## 2023-01-21 DIAGNOSIS — R059 Cough, unspecified: Secondary | ICD-10-CM | POA: Diagnosis not present

## 2023-01-21 DIAGNOSIS — R911 Solitary pulmonary nodule: Secondary | ICD-10-CM | POA: Diagnosis not present

## 2023-01-21 DIAGNOSIS — Z7982 Long term (current) use of aspirin: Secondary | ICD-10-CM

## 2023-01-21 DIAGNOSIS — J09X1 Influenza due to identified novel influenza A virus with pneumonia: Secondary | ICD-10-CM

## 2023-01-21 DIAGNOSIS — E860 Dehydration: Secondary | ICD-10-CM | POA: Diagnosis present

## 2023-01-21 DIAGNOSIS — N400 Enlarged prostate without lower urinary tract symptoms: Secondary | ICD-10-CM | POA: Diagnosis present

## 2023-01-21 DIAGNOSIS — E876 Hypokalemia: Secondary | ICD-10-CM

## 2023-01-21 DIAGNOSIS — J4521 Mild intermittent asthma with (acute) exacerbation: Secondary | ICD-10-CM

## 2023-01-21 DIAGNOSIS — B9789 Other viral agents as the cause of diseases classified elsewhere: Secondary | ICD-10-CM | POA: Diagnosis not present

## 2023-01-21 DIAGNOSIS — Z1152 Encounter for screening for COVID-19: Secondary | ICD-10-CM

## 2023-01-21 DIAGNOSIS — R531 Weakness: Secondary | ICD-10-CM | POA: Diagnosis not present

## 2023-01-21 DIAGNOSIS — J101 Influenza due to other identified influenza virus with other respiratory manifestations: Principal | ICD-10-CM | POA: Diagnosis present

## 2023-01-21 LAB — RESPIRATORY PANEL BY PCR

## 2023-01-21 LAB — CBC
HCT: 42.5 % (ref 39.0–52.0)
Hemoglobin: 15.6 g/dL (ref 13.0–17.0)
MCH: 30.4 pg (ref 26.0–34.0)
MCHC: 36.7 g/dL — ABNORMAL HIGH (ref 30.0–36.0)
MCV: 82.8 fL (ref 80.0–100.0)
Platelets: 155 10*3/uL (ref 150–400)
RBC: 5.13 MIL/uL (ref 4.22–5.81)
RDW: 12.5 % (ref 11.5–15.5)
WBC: 5.6 10*3/uL (ref 4.0–10.5)
nRBC: 0 % (ref 0.0–0.2)

## 2023-01-21 LAB — BASIC METABOLIC PANEL
Anion gap: 14 (ref 5–15)
BUN: 16 mg/dL (ref 8–23)
CO2: 23 mmol/L (ref 22–32)
Calcium: 9.5 mg/dL (ref 8.9–10.3)
Chloride: 90 mmol/L — ABNORMAL LOW (ref 98–111)
Creatinine, Ser: 1.16 mg/dL (ref 0.61–1.24)
GFR, Estimated: >60 mL/min (ref >60)
Glucose, Bld: 137 mg/dL — ABNORMAL HIGH (ref 70–99)
Potassium: 2.9 mmol/L — ABNORMAL LOW (ref 3.5–5.1)
Sodium: 127 mmol/L — ABNORMAL LOW (ref 135–145)

## 2023-01-21 LAB — TROPONIN I (HIGH SENSITIVITY)
Troponin I (High Sensitivity): 6 ng/L (ref <18)
Troponin I (High Sensitivity): 5 ng/L (ref <18)

## 2023-01-21 LAB — MAGNESIUM: Magnesium: 2.1 mg/dL (ref 1.7–2.4)

## 2023-01-21 MED ORDER — IOHEXOL 350 MG/ML SOLN
75.0000 mL | Freq: Once | INTRAVENOUS | Status: AC | PRN
  Administered 2023-01-21: 75 mL via INTRAVENOUS

## 2023-01-21 MED ORDER — METHYLPREDNISOLONE SODIUM SUCC 40 MG IJ SOLR
40.0000 mg | Freq: Two times a day (BID) | INTRAMUSCULAR | Status: DC
  Administered 2023-01-21 – 2023-01-23 (×4): 40 mg via INTRAVENOUS
  Filled 2023-01-21 (×4): qty 1

## 2023-01-21 MED ORDER — POTASSIUM CHLORIDE IN NACL 20-0.9 MEQ/L-% IV SOLN
INTRAVENOUS | Status: DC
  Filled 2023-01-21 (×5): qty 1000

## 2023-01-21 MED ORDER — TAMSULOSIN HCL 0.4 MG PO CAPS
0.4000 mg | ORAL_CAPSULE | Freq: Every day | ORAL | Status: DC
  Administered 2023-01-21 – 2023-01-22 (×2): 0.4 mg via ORAL
  Filled 2023-01-21 (×2): qty 1

## 2023-01-21 MED ORDER — IPRATROPIUM-ALBUTEROL 0.5-2.5 (3) MG/3ML IN SOLN
9.0000 mL | Freq: Once | RESPIRATORY_TRACT | Status: AC
  Administered 2023-01-21: 9 mL via RESPIRATORY_TRACT
  Filled 2023-01-21: qty 9

## 2023-01-21 MED ORDER — PREDNISONE 20 MG PO TABS
60.0000 mg | ORAL_TABLET | Freq: Once | ORAL | Status: AC
  Administered 2023-01-21: 60 mg via ORAL
  Filled 2023-01-21: qty 3

## 2023-01-21 MED ORDER — POTASSIUM CHLORIDE 20 MEQ PO PACK
60.0000 meq | PACK | Freq: Every day | ORAL | Status: DC
  Administered 2023-01-21: 60 meq via ORAL
  Filled 2023-01-21: qty 3

## 2023-01-21 MED ORDER — ALPRAZOLAM 0.25 MG PO TABS
0.2500 mg | ORAL_TABLET | Freq: Every evening | ORAL | Status: DC | PRN
  Administered 2023-01-21 – 2023-01-22 (×2): 0.25 mg via ORAL
  Filled 2023-01-21 (×2): qty 1

## 2023-01-21 MED ORDER — ENOXAPARIN SODIUM 40 MG/0.4ML IJ SOSY
40.0000 mg | PREFILLED_SYRINGE | INTRAMUSCULAR | Status: DC
  Administered 2023-01-21 – 2023-01-22 (×2): 40 mg via SUBCUTANEOUS
  Filled 2023-01-21 (×2): qty 0.4

## 2023-01-21 MED ORDER — SODIUM CHLORIDE 0.9 % IV BOLUS
1000.0000 mL | Freq: Once | INTRAVENOUS | Status: AC
  Administered 2023-01-21: 1000 mL via INTRAVENOUS

## 2023-01-21 MED ORDER — PRAVASTATIN SODIUM 20 MG PO TABS
20.0000 mg | ORAL_TABLET | Freq: Every day | ORAL | Status: DC
  Administered 2023-01-22: 20 mg via ORAL
  Filled 2023-01-21: qty 1

## 2023-01-21 MED ORDER — SERTRALINE HCL 50 MG PO TABS
50.0000 mg | ORAL_TABLET | Freq: Every day | ORAL | Status: DC
  Administered 2023-01-22 – 2023-01-23 (×2): 50 mg via ORAL
  Filled 2023-01-21 (×2): qty 1

## 2023-01-21 MED ORDER — LEVOTHYROXINE SODIUM 112 MCG PO TABS
112.0000 ug | ORAL_TABLET | Freq: Every day | ORAL | Status: DC
  Administered 2023-01-22 – 2023-01-23 (×2): 112 ug via ORAL
  Filled 2023-01-21 (×2): qty 1

## 2023-01-21 MED ORDER — ONDANSETRON HCL 4 MG/2ML IJ SOLN: 4.0000 mg | Freq: Once | INTRAMUSCULAR | Status: DC

## 2023-01-21 MED ORDER — TRAMADOL HCL 50 MG PO TABS: 50.0000 mg | ORAL_TABLET | Freq: Four times a day (QID) | ORAL | Status: DC | PRN

## 2023-01-21 MED ORDER — AZELASTINE HCL 0.1 % NA SOLN
1.0000 | Freq: Two times a day (BID) | NASAL | Status: DC
  Administered 2023-01-21 – 2023-01-23 (×4): 1 via NASAL
  Filled 2023-01-21 (×4): qty 30

## 2023-01-21 MED ORDER — IPRATROPIUM-ALBUTEROL 0.5-2.5 (3) MG/3ML IN SOLN
3.0000 mL | RESPIRATORY_TRACT | Status: DC | PRN
  Administered 2023-01-22 – 2023-01-23 (×3): 3 mL via RESPIRATORY_TRACT
  Filled 2023-01-21 (×3): qty 3

## 2023-01-21 MED ORDER — ASPIRIN 81 MG PO TBEC
81.0000 mg | DELAYED_RELEASE_TABLET | Freq: Every day | ORAL | Status: DC
  Administered 2023-01-22 – 2023-01-23 (×2): 81 mg via ORAL
  Filled 2023-01-21 (×2): qty 1

## 2023-01-21 MED ORDER — FLUTICASONE PROPIONATE 50 MCG/ACT NA SUSP
2.0000 | Freq: Every day | NASAL | Status: DC
  Administered 2023-01-21 – 2023-01-23 (×3): 2 via NASAL
  Filled 2023-01-21 (×2): qty 16

## 2023-01-21 MED ORDER — OSELTAMIVIR PHOSPHATE 30 MG PO CAPS
30.0000 mg | ORAL_CAPSULE | Freq: Two times a day (BID) | ORAL | Status: DC
  Administered 2023-01-21 – 2023-01-23 (×4): 30 mg via ORAL
  Filled 2023-01-21 (×4): qty 1

## 2023-01-21 NOTE — ED Triage Notes (Signed)
From home [facility] SOB and weakness since yesterday. Wife tested + for flu A. Pt states was already sick with URI. Pt states has been coughing since yesterday also.    Rhonchi bilaterally, was 82% on RA, 4L placed then 98%. Temp 98.9. HR 63. Pt is 97% on RA in triage.

## 2023-01-21 NOTE — H&P (Signed)
History and Physical    Patient: Bradley Holland ZOX:[id number] DOB: 1945-10-04 DOA: 01/21/2023 DOS: the patient was seen and examined on 01/21/2023 PCP: Lynnea Ferrier, MD  Patient coming from: Home  Chief Complaint:  Chief Complaint  Patient presents with   Shortness of Breath   Weakness   HPI: Bradley Holland is a 77 y.o. 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  He was last seen in the ED on account of back pain 3 days prior.  He also reports his wife has been battling with flu over the past several days and she had been prescribed tamiflu since yesterday.  He has noted worsening generalized weakness coughing spells and shortness of breath.  Today, he felt extremely weak and presented to the emergency room to be further evaluated.  He admitted to expiratory wheezes.  He denied any chest pain or palpitations however.  Denied any nausea or vomiting.  Denies any lower extremity edema.  He denies any soreness of throat.  In the ED, workup was concerning for serum sodium 137, potassium 2.9 chloride of 90, BUN and creatinine were 16 and 1.16 respectively.  Flu and COVID studies have been ordered and pending.  In the ED, patient was treated with IV fluids, potassium was repleted, prednisone and bronchodilators were also offered. Review of Systems: As mentioned in the history of present illness. All other systems reviewed and are negative. Past Medical History:  Diagnosis Date   Aortic atherosclerosis (HCC)    Arthritis    Asthma    DDD (degenerative disc disease), thoracic 12/12/2019   GERD (gastroesophageal reflux disease)    Hearing loss    Hx of degenerative disc disease    Hypertension    Hypothyroidism    Prostate enlargement    Radiculitis, thoracic 12/18/2019   Shingles Jan 2013   Sleep apnea     Past Surgical History:  Procedure Laterality Date   BACK SURGERY     COLONOSCOPY  09/14/2011   Dr. Marva Panda   COLONOSCOPY WITH PROPOFOL N/A 07/09/2015   Procedure: COLONOSCOPY WITH PROPOFOL;  Surgeon: Earline Mayotte, MD;  Location: [facility] ENDOSCOPY;  Service: Endoscopy;  Laterality: N/A;   COLONOSCOPY WITH PROPOFOL N/A 09/05/2020   Procedure: COLONOSCOPY WITH PROPOFOL;  Surgeon: Earline Mayotte, MD;  Location: [facility] ENDOSCOPY;  Service: Endoscopy;  Laterality: N/A;   NECK SURGERY     PALATE SURGERY     Social History:  reports that he has never smoked. He has never used smokeless tobacco. He reports that he does not drink alcohol and does not use drugs.  Allergies  Allergen Reactions   Ambien [Zolpidem Tartrate] Other (See Comments)    "crazy"   Simvastatin Other (See Comments)   Sulfa Antibiotics Rash    History reviewed. No pertinent family history.  Prior to Admission medications   Medication Sig Start Date End Date Taking? Authorizing Provider  ALPRAZolam (XANAX) 0.25 MG tablet TAKE 1 TABLET TWICE A DAY AS NEEDED FOR SLEEP 02/10/15   [provider]  aspirin 81 MG tablet Take 81 mg by mouth daily.    [provider]  azelastine (ASTELIN) 0.1 % nasal spray Place 1 spray into both nostrils 2 (two) times daily. Use in each nostril as directed    [provider]  B Complex Vitamins (B-COMPLEX/B-12 PO) Take 1 tablet by mouth daily.  [provider]  fluticasone (FLONASE) 50 MCG/ACT nasal spray Place 2 sprays into both nostrils daily.    [provider]  levothyroxine (SYNTHROID) 112 MCG tablet PLEASE SEE ATTACHED FOR DETAILED DIRECTIONS 02/21/19   [provider]  lovastatin (MEVACOR) 40 MG tablet TAKE 1 TABLET EVERY DAY 12/09/14   [provider]  sertraline (ZOLOFT) 100 MG tablet 50 mg daily as needed.  12/09/14   [provider]  tadalafil (CIALIS) 5 MG tablet Take 5 mg by mouth daily as needed for erectile  dysfunction.    [provider]  tamsulosin (FLOMAX) 0.4 MG CAPS capsule as needed.  Patient not taking: Reported on 09/05/2020 03/12/15   [provider]  traMADol (ULTRAM) 50 MG tablet Take 50 mg by mouth as needed.  12/25/14   [provider]  TRIAMTERENE-HCTZ PO Take 1 tablet by mouth daily.    [provider]    Physical Exam: Vitals:   01/21/23 1459 01/21/23 1500 01/21/23 1700 01/21/23 1715  BP: 115/64  102/62   Pulse: 76  75 65  Resp: (!) 24  16 19   Temp: 98.4 F (36.9 C)     TempSrc: Oral     SpO2: 97%  97% 99%  Weight:  77.1 kg    Height:  5\' 7"  (1.702 m)     General: Patient is alert oriented x 3.  Not in acute respiratory distress.  He is hard of hearing. HEENT: Oral mucosa dry.  Nasal cannula in situ.  Could not visualize pharynx. Cardiovascular: Regular S1-S2 with no murmur Respiratory: Diminished breath sounds bilaterally.  No rales or rhonchi appreciated. Abdomen: Soft nontender.  No organomegaly Extremities: Without pedal edema.  Peripheral pulses palpable. CNS: No obvious focal deficits appreciated. Skin: Negative for any new rash. Data Reviewed: Magnesium 2.1, troponin 5, sodium 127, potassium 2.9, chloride 98, glucose 137, BUN 16, creatinine 1.16 calcium 9.5, WBC 5.6, hemoglobin 15.6, hematocrit 42.5, platelet count 155, glucose 137.  Chest x-ray was negative for any acute findings.  Notable 11 x 5 mm nodule in right upper lobe.  EKG shows sinus rhythm with evidence of right bundle branch block.  Respiratory panel Results are pending, will review when available.  Assessment and Plan:   Acute viral bronchitis: Respiratory panel came back positive for influenza A.  Patient will be placed in respiratory isolation.  He was initiated on Tamiflu.  Asthma exacerbation most likely secondary to viral bronchitis: Optimize with bronchodilators and steroids.  Oxygen supplementations as needed.  Attempt to wean off oxygen as tolerated by  patient when clinically stable  Hyponatremia and hypokalemia: Most likely due to dehydration and poor oral intake.   Patient will be well repleted with IV fluids. SSRI may likely be contributory to hyponatremia.  Will hold diuretics with chlorthalidone.  This may likely be as a sequelae of viral prodrome.  Potassium replacement protocol in place.  Magnesium however remained stable and normal  Hypertension: Patient is on amlodipine.  Will resume blood pressure on same as tolerated by BP.  Incidental finding of right upper lobe lung nodule: Outpatient CT scan will be warranted for further evaluation.  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  The patient is status post recent epidural injections.  Patient reports no significant improvement.  Status post lumbar spine surgery in 2001 and 2006.  History of postherpetic neuralgia: Follows up with [facility] pain clinic.  Dyslipidemia: Continue statins.  Hypothyroidism: On Synthroid.  Advance Care Planning:   Code Status: Full Code   Consults: None  Family Communication: Family at bedside were updated regarding plan of care as detailed.  Severity of Illness: The appropriate patient status for this patient is OBSERVATION. Observation status is judged to be reasonable and necessary in order to provide the required intensity of service to ensure the patient's safety. The patient's presenting symptoms, physical exam findings, and initial radiographic and laboratory data in the context of their medical condition is felt to place them at decreased risk for further clinical deterioration. Furthermore, it is anticipated that the patient will be medically stable for discharge from the hospital within 2 midnights of admission.   Author: Lilia Pro, MD 01/21/2023 6:43 PM  For on call review [email or web address].

## 2023-01-21 NOTE — ED Provider Notes (Addendum)
[facility] Provider Note    Event Date/Time   First MD Initiated Contact with Patient 01/21/23 [phone]     (approximate)   History   Shortness of Breath and Weakness   HPI  Bradley Holland is a 77 y.o. male   Past medical history of asthma, hypertension, hypothyroid who presents to the emergency department with 1 week of productive cough, nasal congestion, exertional dyspnea.  Denies chest pain, no fever.  His wife is sick with the flu.  He started some TheraFlu yesterday but has been feeling worse today.  He has generalized weakness and fatigue.  He feels dehydrated has not been eating or drinking very well this week.   Independent Historian contributed to assessment above: His friend Dorene Sorrow is at bedside corroborates information given above     Physical Exam   Triage Vital Signs: ED Triage Vitals  Enc Vitals Group     BP 01/21/23 1459 115/64     Pulse Rate 01/21/23 1459 76     Resp 01/21/23 1459 (!) 24     Temp 01/21/23 1459 98.4 F (36.9 C)     Temp Source 01/21/23 1459 Oral     SpO2 01/21/23 1459 97 %     Weight 01/21/23 1500 170 lb (77.1 kg)     Height 01/21/23 1500 5\' 7"  (1.702 m)     Head Circumference --      Peak Flow --      Pain Score 01/21/23 1457 0     Pain Loc --      Pain Edu? --      Excl. in GC? --     Most recent vital signs: Vitals:   01/21/23 1459  BP: 115/64  Pulse: 76  Resp: (!) 24  Temp: 98.4 F (36.9 C)  SpO2: 97%    General: Awake, no distress.  CV:  Good peripheral perfusion.  Resp:  Normal effort.  Abd:  No distention.  Other:  Increased respiratory rate at 24, will occasionally desaturate when talking to 87%.  Lungs with some scant wheezing at the apices bilaterally no focality.  He appears mildly dehydrated with dry mucous membranes poor poor skin turgor   ED Results / Procedures / Treatments   Labs (all labs ordered are listed, but only abnormal results are displayed) Labs Reviewed  BASIC  METABOLIC PANEL - Abnormal; Notable for the following components:      Result Value   Sodium 127 (*)    Potassium 2.9 (*)    Chloride 90 (*)    Glucose, Bld 137 (*)    All other components within normal limits  CBC - Abnormal; Notable for the following components:   MCHC 36.7 (*)    All other components within normal limits  RESPIRATORY PANEL BY PCR  MAGNESIUM  TROPONIN I (HIGH SENSITIVITY)  TROPONIN I (HIGH SENSITIVITY)     I ordered and reviewed the above labs they are notable for he has hypokalemia 2.9 and sodium of 127  EKG  ED ECG REPORT I, Pilar Jarvis, the attending physician, personally viewed and interpreted this ECG.   Date: 01/21/2023  EKG Time: 1507  Rate: 77  Rhythm: sinus  Axis: nl  ST&T Change: No acute ischemic changes    RADIOLOGY I independently reviewed and interpreted chest x-ray see no obvious focality or pneumothorax   PROCEDURES:  Critical Care performed: Yes, see critical care procedure note(s)  .Critical Care  Performed by: Pilar Jarvis, MD Authorized by:  Pilar Jarvis, MD   Critical care provider statement:    Critical care time (minutes):  30   Critical care was time spent personally by me on the following activities:  Development of treatment plan with patient or surrogate, discussions with consultants, evaluation of patient's response to treatment, examination of patient, ordering and review of laboratory studies, ordering and review of radiographic studies, ordering and performing treatments and interventions, pulse oximetry, re-evaluation of patient's condition and review of old charts    MEDICATIONS ORDERED IN ED: Medications  sodium chloride 0.9 % bolus 1,000 mL (has no administration in time range)  ipratropium-albuterol (DUONEB) 0.5-2.5 (3) MG/3ML nebulizer solution 9 mL (has no administration in time range)  predniSONE (DELTASONE) tablet 60 mg (has no administration in time range)  potassium chloride (KLOR-CON) packet 60 mEq (has  no administration in time range)    External physician / consultants:  I spoke with hospitalist for admission and regarding care plan for this patient.   IMPRESSION / MDM / ASSESSMENT AND PLAN / ED COURSE  I reviewed the triage vital signs and the nursing notes.                                Patient's presentation is most consistent with acute presentation with potential threat to life or bodily function.  Differential diagnosis includes, but is not limited to, viral URI, bacterial pneumonia, PE, hypoxemic respiratory failure, ACS   The patient is on the cardiac monitor to evaluate for evidence of arrhythmia and/or significant heart rate changes.  MDM: Is a patient with viral URI symptoms cough nasal congestion fatigue with new oxygen requirement and wheezing history of asthma evidence of asthma exacerbation.  I ordered for nasal cannula oxygen starting at 2 L to maintain oxygen levels as well as a DuoNeb and prednisone acute asthma exacerbation in the setting of likely viral URI.  I got a viral panel.  He has no evidence of bacterial pneumonia on chest x-ray, no focality on auscultation, and normal white blood cell count no fever I will defer on antibiotics at this time.  He does have new oxygen requirement and borderline hypotension with systolics in the 100 most likely in the setting of viral URI/asthma/dehydration but will obtain CT angiogram to rule out PE.   Lyte derangements include hypokalemia and hyponatremia in the setting of dehydration most likely will be repleted with IV saline infusions as well as potassium repletion.   He will be admitted.       FINAL CLINICAL IMPRESSION(S) / ED DIAGNOSES   Final diagnoses:  Mild intermittent asthma with exacerbation  Viral URI with cough  Hypoxia  Hypokalemia  Hyponatremia     Rx / DC Orders   ED Discharge Orders     None        Note:  This document was prepared using Dragon voice recognition software and may  include unintentional dictation errors.    Pilar Jarvis, MD 01/21/23 Lizbeth Bark    Pilar Jarvis, MD 01/21/23 [phone]

## 2023-01-21 NOTE — ED Notes (Signed)
Placed on 2L O2 per Modesto Charon, MD

## 2023-01-21 NOTE — Progress Notes (Signed)
PHARMACY NOTE:  ANTIMICROBIAL RENAL DOSAGE ADJUSTMENT  Current antimicrobial regimen includes a mismatch between antimicrobial dosage and estimated renal function.  As per policy approved by the Pharmacy & Therapeutics and Medical Executive Committees, the antimicrobial dosage will be adjusted accordingly.  Current antimicrobial dosage:  Oseltamivir 30 mg PO daily   Indication: Flu (A)   Renal Function:  Estimated Creatinine Clearance: 49.9 mL/min (by C-G formula based on SCr of 1.16 mg/dL). []      On intermittent HD, scheduled: []      On CRRT    Antimicrobial dosage has been changed to:  Oseltamivir 30 mg PO BID X 5 days starting 5/24 @ 2200   Additional comments:   Thank you for allowing pharmacy to be a part of this patient's care.  Scherrie Gerlach, [facility] 01/21/2023 9:52 PM

## 2023-01-21 NOTE — ED Notes (Signed)
Spoke with RN Doyne Keel on 1C, who stated pt was okay to come to room upstairs at this time.

## 2023-01-22 DIAGNOSIS — E785 Hyperlipidemia, unspecified: Secondary | ICD-10-CM | POA: Diagnosis present

## 2023-01-22 DIAGNOSIS — E039 Hypothyroidism, unspecified: Secondary | ICD-10-CM | POA: Diagnosis present

## 2023-01-22 DIAGNOSIS — N400 Enlarged prostate without lower urinary tract symptoms: Secondary | ICD-10-CM | POA: Diagnosis present

## 2023-01-22 DIAGNOSIS — J9601 Acute respiratory failure with hypoxia: Secondary | ICD-10-CM | POA: Diagnosis present

## 2023-01-22 DIAGNOSIS — G8929 Other chronic pain: Secondary | ICD-10-CM | POA: Diagnosis present

## 2023-01-22 DIAGNOSIS — J4521 Mild intermittent asthma with (acute) exacerbation: Secondary | ICD-10-CM | POA: Diagnosis present

## 2023-01-22 DIAGNOSIS — Z7982 Long term (current) use of aspirin: Secondary | ICD-10-CM | POA: Diagnosis not present

## 2023-01-22 DIAGNOSIS — J101 Influenza due to other identified influenza virus with other respiratory manifestations: Secondary | ICD-10-CM | POA: Diagnosis present

## 2023-01-22 DIAGNOSIS — Z1152 Encounter for screening for COVID-19: Secondary | ICD-10-CM | POA: Diagnosis not present

## 2023-01-22 DIAGNOSIS — J069 Acute upper respiratory infection, unspecified: Secondary | ICD-10-CM

## 2023-01-22 DIAGNOSIS — J208 Acute bronchitis due to other specified organisms: Secondary | ICD-10-CM | POA: Diagnosis present

## 2023-01-22 DIAGNOSIS — K219 Gastro-esophageal reflux disease without esophagitis: Secondary | ICD-10-CM | POA: Diagnosis present

## 2023-01-22 DIAGNOSIS — R911 Solitary pulmonary nodule: Secondary | ICD-10-CM | POA: Diagnosis present

## 2023-01-22 DIAGNOSIS — I10 Essential (primary) hypertension: Secondary | ICD-10-CM | POA: Diagnosis present

## 2023-01-22 DIAGNOSIS — E871 Hypo-osmolality and hyponatremia: Secondary | ICD-10-CM

## 2023-01-22 DIAGNOSIS — M5136 Other intervertebral disc degeneration, lumbar region: Secondary | ICD-10-CM | POA: Diagnosis present

## 2023-01-22 DIAGNOSIS — E876 Hypokalemia: Secondary | ICD-10-CM

## 2023-01-22 DIAGNOSIS — J209 Acute bronchitis, unspecified: Secondary | ICD-10-CM | POA: Diagnosis present

## 2023-01-22 DIAGNOSIS — Z79899 Other long term (current) drug therapy: Secondary | ICD-10-CM | POA: Diagnosis not present

## 2023-01-22 DIAGNOSIS — M48061 Spinal stenosis, lumbar region without neurogenic claudication: Secondary | ICD-10-CM | POA: Diagnosis present

## 2023-01-22 DIAGNOSIS — R0902 Hypoxemia: Secondary | ICD-10-CM

## 2023-01-22 DIAGNOSIS — E860 Dehydration: Secondary | ICD-10-CM | POA: Diagnosis present

## 2023-01-22 DIAGNOSIS — J09X1 Influenza due to identified novel influenza A virus with pneumonia: Secondary | ICD-10-CM

## 2023-01-22 DIAGNOSIS — H9190 Unspecified hearing loss, unspecified ear: Secondary | ICD-10-CM | POA: Diagnosis present

## 2023-01-22 DIAGNOSIS — Z79890 Hormone replacement therapy (postmenopausal): Secondary | ICD-10-CM | POA: Diagnosis not present

## 2023-01-22 LAB — CBC
HCT: 37.4 % — ABNORMAL LOW (ref 39.0–52.0)
Hemoglobin: 13.6 g/dL (ref 13.0–17.0)
MCH: 30.9 pg (ref 26.0–34.0)
MCHC: 36.4 g/dL — ABNORMAL HIGH (ref 30.0–36.0)
MCV: 85 fL (ref 80.0–100.0)
Platelets: 139 10*3/uL — ABNORMAL LOW (ref 150–400)
RBC: 4.4 MIL/uL (ref 4.22–5.81)
RDW: 12.6 % (ref 11.5–15.5)
WBC: 3.1 10*3/uL — ABNORMAL LOW (ref 4.0–10.5)
nRBC: 0 % (ref 0.0–0.2)

## 2023-01-22 LAB — BASIC METABOLIC PANEL
Anion gap: 8 (ref 5–15)
BUN: 17 mg/dL (ref 8–23)
CO2: 25 mmol/L (ref 22–32)
Calcium: 8.9 mg/dL (ref 8.9–10.3)
Chloride: 99 mmol/L (ref 98–111)
Creatinine, Ser: 1.04 mg/dL (ref 0.61–1.24)
GFR, Estimated: >60 mL/min (ref >60)
Glucose, Bld: 166 mg/dL — ABNORMAL HIGH (ref 70–99)
Potassium: 2.9 mmol/L — ABNORMAL LOW (ref 3.5–5.1)
Sodium: 132 mmol/L — ABNORMAL LOW (ref 135–145)

## 2023-01-22 LAB — HIV ANTIBODY (ROUTINE TESTING W REFLEX): HIV Screen 4th Generation wRfx: NONREACTIVE

## 2023-01-22 MED ORDER — POTASSIUM CHLORIDE 10 MEQ/100ML IV SOLN: 10.0000 meq | INTRAVENOUS | Status: DC

## 2023-01-22 MED ORDER — POTASSIUM CHLORIDE CRYS ER 20 MEQ PO TBCR
40.0000 meq | EXTENDED_RELEASE_TABLET | Freq: Two times a day (BID) | ORAL | Status: AC
  Administered 2023-01-22 (×2): 40 meq via ORAL
  Filled 2023-01-22 (×2): qty 2

## 2023-01-22 NOTE — Progress Notes (Signed)
PROGRESS NOTE  Bradley Holland    DOB: 05-03-1946, 77 y.o.  ZOX:[id number]    Code Status: Full Code   DOA: 01/21/2023   LOS: 0   Brief hospital course  Bradley Holland is a 77 y.o. 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  They presented from home to the ED on 01/21/2023 with respiratory distress x 3 days. He and his wife tested positive for influenza.   In the ED, it was found that they had stable vital signs with the exception of new oxygen requirement of 2L to maintain O2 sats >90%.  Significant findings included sodium 137, potassium 2.9 chloride of 90, BUN and creatinine were 16 and 1.16 respectively. RVP positive for influenza  They were initially treated with supplemental oxygen.   Patient was admitted to medicine service for further workup and management of acute hypoxic respiratory failure as outlined in detail below.  01/22/23 -stable on 2L. Feels unwell  Assessment & Plan  Principal Problem:   Acute bronchitis  Acute hypoxic respiratory failure  influenza A+- requiring 2L O2 [name] -  continue supportive care including O2 - wean O2 as tolerated  Asthma-  - continue breathing treatments PRN  Electrolyte abnormalities- Na+ 132, K+ 2.9. patient states this is chronic for him and takes K+ supplement outpatient - monitor and replete PRN  HTN- well controlled - continue home meds- amlodipine  Incidental finding of right upper lobe lung nodule: Outpatient CT scan will be warranted for further evaluation.   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  The patient is status post recent epidural injections.  Patient reports no significant improvement.  Status post lumbar spine surgery in 2001 and 2006.   History of postherpetic neuralgia: Follows up  with [facility] pain clinic.   Dyslipidemia: Continue statins.   Hypothyroidism: On Synthroid.  Body mass index is 26.63 kg/m.  VTE ppx: enoxaparin (LOVENOX) injection 40 mg Start: 01/21/23 2200 Place TED hose Start: 01/21/23 1840   Diet:     Diet   Diet regular Room service appropriate? Yes; Fluid consistency: Thin   Consultants: None   Subjective 01/22/23    Pt reports feeling unwell. Denies respiratory distress at rest. Has significant cough and nasal drainage. His wife is also ill at home.    Objective   Vitals:   01/21/23 2000 01/21/23 2014 01/21/23 2049 01/22/23 0436  BP: 109/75  (!) 140/74 132/71  Pulse: 98  89 61  Resp: 19  19 18   Temp:  98.6 F (37 C) 98.3 F (36.8 C) 98.7 F (37.1 C)  TempSrc:  Oral Oral   SpO2: 94%  94% 100%  Weight:      Height:        Intake/Output Summary (Last 24 hours) at 01/22/2023 0719 Last data filed at 01/22/2023 0600 Gross per 24 hour  Intake --  Output 1200 ml  Net -1200 ml   Filed Weights   01/21/23 1500  Weight: 77.1 kg     Physical Exam:  General: awake, alert, NAD. Ill-appearing.  HEENT: atraumatic, clear conjunctiva, anicteric sclera, MMM, hearing grossly normal. Nasal drainage.  Respiratory: normal respiratory effort. Positive rhonchi. Wet cough.  Cardiovascular: quick capillary refill, normal S1/S2, RRR, no JVD, murmurs Nervous: A&O x3. no gross focal neurologic deficits, normal speech Extremities: moves all equally, no edema, normal tone  Skin: dry, intact, normal temperature, normal color. No rashes, lesions or ulcers on exposed skin Psychiatry: normal mood, congruent affect  Labs   I have personally reviewed the following labs and imaging studies CBC    Component Value Date/Time   WBC 3.1 (L) 01/22/2023 0534   RBC 4.40 01/22/2023 0534   HGB 13.6 01/22/2023 0534   HCT 37.4 (L) 01/22/2023 0534   PLT 139 (L) 01/22/2023 0534   MCV 85.0 01/22/2023 0534   MCH 30.9 01/22/2023 0534   MCHC 36.4 (H) 01/22/2023  0534   RDW 12.6 01/22/2023 0534   LYMPHSABS 1.5 05/25/2007 1041   MONOABS 0.4 05/25/2007 1041   EOSABS 0.1 05/25/2007 1041   BASOSABS 0.0 05/25/2007 1041      Latest Ref Rng & Units 01/22/2023    5:34 AM 01/21/2023    3:03 PM 08/17/2020    3:04 PM  BMP  Glucose 70 - 99 mg/dL 295  621  308   BUN 8 - 23 mg/dL 17  16  13    Creatinine 0.61 - 1.24 mg/dL 6.57  8.46  9.62   Sodium 135 - 145 mmol/L 132  127  139   Potassium 3.5 - 5.1 mmol/L 2.9  2.9  3.1   Chloride 98 - 111 mmol/L 99  90  99   CO2 22 - 32 mmol/L 25  23  29    Calcium 8.9 - 10.3 mg/dL 8.9  9.5  95.2     CT Angio Chest PE W/Cm &/Or Wo Cm  Result Date: 01/21/2023 CLINICAL DATA:  Productive cough EXAM: CT ANGIOGRAPHY CHEST WITH CONTRAST TECHNIQUE: Multidetector CT imaging of the chest was performed using the standard protocol during bolus administration of intravenous contrast. Multiplanar CT image reconstructions and MIPs were obtained to evaluate the vascular anatomy. RADIATION DOSE REDUCTION: This exam was performed according to the departmental dose-optimization program which includes automated exposure control, adjustment of the mA and/or kV according to patient size and/or use of iterative reconstruction technique. CONTRAST:  75mL OMNIPAQUE IOHEXOL 350 MG/ML SOLN COMPARISON:  Chest x-ray 01/21/2023, chest CT 06/24/2015 FINDINGS: Cardiovascular: Satisfactory opacification of the pulmonary arteries to the segmental level. No evidence of pulmonary embolism. Moderate aortic atherosclerosis. No aneurysm. Coronary vascular calcification. Normal cardiac size. No pericardial effusion Mediastinum/Nodes: No enlarged mediastinal, hilar, or axillary lymph nodes. Thyroid gland, trachea, and esophagus demonstrate no significant findings. Lungs/Pleura: No acute airspace disease, pleural effusion, or pneumothorax. No suspicious pulmonary nodule. Upper Abdomen: No acute finding.  Gallstones Musculoskeletal: No acute or suspicious osseous abnormality  Review of the MIP images confirms the above findings. IMPRESSION: 1. Negative for acute pulmonary embolus or aortic dissection. Clear lung fields. 2. Gallstones. 3. Aortic atherosclerosis. Aortic Atherosclerosis (ICD10-I70.0). Electronically Signed   By: Jasmine Pang M.D.   On: 01/21/2023 18:07   DG Chest 2 View  Result Date: 01/21/2023 CLINICAL DATA:  Shortness of breath. Weakness. EXAM: CHEST - 2 VIEW COMPARISON:  Remote radiograph 05/25/2007, CT 06/24/2015 FINDINGS: The heart is normal in size. Descending aortic tortuosity. Mediastinal contours are otherwise normal. No focal airspace disease. Question of 11 x 5 mm nodule projecting over the right upper lung zone. Alternatively this may represent a bone island. Normal pulmonary vasculature. No pleural effusion or pneumothorax. Cervical spine hardware is partially included. There is thoracic spondylosis with anterior spurring. IMPRESSION: 1. No acute chest findings. 2. Question of 11 x 5 mm right upper lung zone nodule. Alternatively this may represent a bone island in the overlying rib. Recommend further  assessment with chest CT, this could be performed on an elective basis. Electronically Signed   By: Narda Rutherford M.D.   On: 01/21/2023 15:34    Disposition Plan & Communication  Patient status: Observation  Admitted From: Home Planned disposition location: Home Anticipated discharge date: 5/26 pending wean from oxygen  Family Communication: none at bedside    Author: Leeroy Bock, DO [facility] 01/22/2023, 7:19 AM   Available by Epic secure chat 7AM-7PM. If 7PM-7AM, please contact night-coverage.  [facility] contact information found on [email or web address].

## 2023-01-22 NOTE — Hospital Course (Signed)
77 y/o with acute asthma exacerbation as a result of acute influenza A viral infection.Patient is requiring oxygen supplementation.Incidental RUL  nodule

## 2023-01-22 NOTE — Progress Notes (Addendum)
Patient oxygen is 92% with oxygen 2lit at rest and his spo2 drop to 85% in room air at rest with shortness of breath. his spo2 90 with oxygen 2 lit while ambulating

## 2023-01-23 DIAGNOSIS — J09X1 Influenza due to identified novel influenza A virus with pneumonia: Secondary | ICD-10-CM | POA: Diagnosis not present

## 2023-01-23 DIAGNOSIS — J069 Acute upper respiratory infection, unspecified: Secondary | ICD-10-CM | POA: Diagnosis not present

## 2023-01-23 DIAGNOSIS — E871 Hypo-osmolality and hyponatremia: Secondary | ICD-10-CM | POA: Diagnosis not present

## 2023-01-23 DIAGNOSIS — E876 Hypokalemia: Secondary | ICD-10-CM | POA: Diagnosis not present

## 2023-01-23 LAB — BASIC METABOLIC PANEL
Anion gap: 6 (ref 5–15)
BUN: 22 mg/dL (ref 8–23)
CO2: 24 mmol/L (ref 22–32)
Calcium: 8.9 mg/dL (ref 8.9–10.3)
Chloride: 103 mmol/L (ref 98–111)
Creatinine, Ser: 0.95 mg/dL (ref 0.61–1.24)
GFR, Estimated: >60 mL/min (ref >60)
Glucose, Bld: 132 mg/dL — ABNORMAL HIGH (ref 70–99)
Potassium: 4.3 mmol/L (ref 3.5–5.1)
Sodium: 133 mmol/L — ABNORMAL LOW (ref 135–145)

## 2023-01-23 LAB — CBC
HCT: 37.9 % — ABNORMAL LOW (ref 39.0–52.0)
Hemoglobin: 13 g/dL (ref 13.0–17.0)
MCH: 30.1 pg (ref 26.0–34.0)
MCHC: 34.3 g/dL (ref 30.0–36.0)
MCV: 87.7 fL (ref 80.0–100.0)
Platelets: 161 10*3/uL (ref 150–400)
RBC: 4.32 MIL/uL (ref 4.22–5.81)
RDW: 13 % (ref 11.5–15.5)
WBC: 8.5 10*3/uL (ref 4.0–10.5)
nRBC: 0 % (ref 0.0–0.2)

## 2023-01-23 NOTE — Discharge Summary (Signed)
Physician Discharge Summary  Patient: Bradley Holland ZOX:[id number] DOB: June 27, 1946   Code Status: Full Code Admit date: 01/21/2023 Discharge date: 01/23/2023 Disposition: Home, No home health services recommended PCP: Lynnea Ferrier, MD  Recommendations for Outpatient Follow-up:  Follow up with PCP within 1-2 weeks Regarding general hospital follow up and preventative care Recommend    Discharge Diagnoses:  Principal Problem:   Acute bronchitis Active Problems:   Acute hypoxemic respiratory failure (HCC)   Viral URI with cough   Hypokalemia   Hyponatremia   Hypoxia   Mild intermittent asthma with exacerbation   Influenza A with pneumonia  Brief Hospital Course Summary: Bradley Holland is a 77 y.o. 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   They presented from home to the ED on 01/21/2023 with respiratory distress x 3 days. He and his wife tested positive for influenza.    In the ED, it was found that they had stable vital signs with the exception of new oxygen requirement of 2L to maintain O2 sats >90%.  Significant findings included sodium 137, potassium 2.9 chloride of 90, BUN and creatinine were 16 and 1.16 respectively. RVP positive for influenza   They were initially treated with supplemental oxygen.    Patient was admitted to medicine service for further workup and management of acute hypoxic respiratory failure as outlined in detail below.   01/23/23 -stable on 2L. Feels unwell  Discharge Condition: {DISCHARGE CONDITION:19696}, improved Recommended discharge diet: {Discharge WJXB:[id number]}  Consultations: ***  Procedures/Studies: ***   Allergies as of 01/23/2023       Reactions   Ambien [zolpidem Tartrate] Other (See Comments)   "crazy"   Simvastatin Other (See Comments)    Sulfa Antibiotics Rash        Medication List     STOP taking these medications    amLODipine 10 MG tablet Commonly known as: NORVASC   chlorthalidone 50 MG tablet Commonly known as: HYGROTON   predniSONE 5 MG (48) Tbpk tablet Commonly known as: STERAPRED UNI-PAK 48 TAB   spironolactone 25 MG tablet Commonly known as: ALDACTONE   tamsulosin 0.4 MG Caps capsule Commonly known as: FLOMAX   TRIAMTERENE-HCTZ PO   triamterene-hydrochlorothiazide 37.5-25 MG tablet Commonly known as: MAXZIDE-25       TAKE these medications    ALPRAZolam 0.25 MG tablet Commonly known as: XANAX TAKE 1 TABLET TWICE A DAY AS NEEDED FOR SLEEP   aspirin 81 MG tablet Take 81 mg by mouth daily.   azelastine 0.1 % nasal spray Commonly known as: ASTELIN Place 1 spray into both nostrils 2 (two) times daily. Use in each nostril as directed   B-COMPLEX/B-12 PO Take 1 tablet by mouth daily.   fluticasone 50 MCG/ACT nasal spray Commonly known as: FLONASE Place 2 sprays into both nostrils daily.   HYDROcodone-acetaminophen 5-325 MG tablet Commonly known as: NORCO/VICODIN Take 1 tablet by mouth every 6 (six) hours as needed.   levothyroxine 112 MCG tablet Commonly known as: SYNTHROID PLEASE SEE ATTACHED FOR DETAILED DIRECTIONS   lovastatin 40 MG tablet Commonly known as: MEVACOR TAKE 1 TABLET EVERY DAY   meclizine 12.5 MG tablet Commonly known as: ANTIVERT Take 12.5-25 mg by mouth 3 (three) times daily as needed.   pantoprazole 40 MG tablet Commonly known as: PROTONIX Take 40 mg by mouth daily.   potassium chloride 10 MEQ  tablet Commonly known as: KLOR-CON M Take 10 mEq by mouth 3 (three) times daily.   sertraline 100 MG tablet Commonly known as: ZOLOFT 50 mg daily as needed.   tadalafil 5 MG tablet Commonly known as: CIALIS Take 5 mg by mouth daily as needed for erectile dysfunction.   tiZANidine 2 MG tablet Commonly known as: ZANAFLEX Take 2 mg by mouth 2 (two) times  daily as needed.   traMADol 50 MG tablet Commonly known as: ULTRAM Take 50 mg by mouth as needed.         Subjective   Pt reports ***  All questions and concerns were addressed at time of discharge.  Objective  Blood pressure 109/62, pulse 64, temperature (!) 97.5 F (36.4 C), temperature source Oral, resp. rate 16, height 5\' 7"  (1.702 m), weight 77.1 kg, SpO2 96 %.   General: Pt is alert, awake, not in acute distress Cardiovascular: RRR, S1/S2 +, no rubs, no gallops Respiratory: CTA bilaterally, no wheezing, no rhonchi Abdominal: Soft, NT, ND, bowel sounds + Extremities: no edema, no cyanosis  The results of significant diagnostics from this hospitalization (including imaging, microbiology, ancillary and laboratory) are listed below for reference.   Imaging studies: CT Angio Chest PE W/Cm &/Or Wo Cm  Result Date: 01/21/2023 CLINICAL DATA:  Productive cough EXAM: CT ANGIOGRAPHY CHEST WITH CONTRAST TECHNIQUE: Multidetector CT imaging of the chest was performed using the standard protocol during bolus administration of intravenous contrast. Multiplanar CT image reconstructions and MIPs were obtained to evaluate the vascular anatomy. RADIATION DOSE REDUCTION: This exam was performed according to the departmental dose-optimization program which includes automated exposure control, adjustment of the mA and/or kV according to patient size and/or use of iterative reconstruction technique. CONTRAST:  75mL OMNIPAQUE IOHEXOL 350 MG/ML SOLN COMPARISON:  Chest x-ray 01/21/2023, chest CT 06/24/2015 FINDINGS: Cardiovascular: Satisfactory opacification of the pulmonary arteries to the segmental level. No evidence of pulmonary embolism. Moderate aortic atherosclerosis. No aneurysm. Coronary vascular calcification. Normal cardiac size. No pericardial effusion Mediastinum/Nodes: No enlarged mediastinal, hilar, or axillary lymph nodes. Thyroid gland, trachea, and esophagus demonstrate no significant  findings. Lungs/Pleura: No acute airspace disease, pleural effusion, or pneumothorax. No suspicious pulmonary nodule. Upper Abdomen: No acute finding.  Gallstones Musculoskeletal: No acute or suspicious osseous abnormality Review of the MIP images confirms the above findings. IMPRESSION: 1. Negative for acute pulmonary embolus or aortic dissection. Clear lung fields. 2. Gallstones. 3. Aortic atherosclerosis. Aortic Atherosclerosis (ICD10-I70.0). Electronically Signed   By: Jasmine Pang M.D.   On: 01/21/2023 18:07   DG Chest 2 View  Result Date: 01/21/2023 CLINICAL DATA:  Shortness of breath. Weakness. EXAM: CHEST - 2 VIEW COMPARISON:  Remote radiograph 05/25/2007, CT 06/24/2015 FINDINGS: The heart is normal in size. Descending aortic tortuosity. Mediastinal contours are otherwise normal. No focal airspace disease. Question of 11 x 5 mm nodule projecting over the right upper lung zone. Alternatively this may represent a bone island. Normal pulmonary vasculature. No pleural effusion or pneumothorax. Cervical spine hardware is partially included. There is thoracic spondylosis with anterior spurring. IMPRESSION: 1. No acute chest findings. 2. Question of 11 x 5 mm right upper lung zone nodule. Alternatively this may represent a bone island in the overlying rib. Recommend further assessment with chest CT, this could be performed on an elective basis. Electronically Signed   By: Narda Rutherford M.D.   On: 01/21/2023 15:34    Labs: Basic Metabolic Panel: Recent Labs  Lab 01/21/23 1503 01/21/23 1708 01/22/23 0534 01/23/23 0732  NA 127*  --  132* 133*  K 2.9*  --  2.9* 4.3  CL 90*  --  99 103  CO2 23  --  25 24  GLUCOSE 137*  --  166* 132*  BUN 16  --  17 22  CREATININE 1.16  --  1.04 0.95  CALCIUM 9.5  --  8.9 8.9  MG  --  2.1  --   --    CBC: Recent Labs  Lab 01/21/23 1503 01/22/23 0534 01/23/23 0732  WBC 5.6 3.1* 8.5  HGB 15.6 13.6 13.0  HCT 42.5 37.4* 37.9*  MCV 82.8 85.0 87.7  PLT 155  139* 161   Microbiology: ***  Time coordinating discharge: Over 30 minutes  Leeroy Bock, MD  [facility] 01/23/2023, 12:19 PM

## 2023-01-23 NOTE — Discharge Instructions (Signed)
I have stopped all of your blood pressure medications since your blood pressure was well controlled and even slightly low while hospitalized. This may be due to having a severe illness so I ask that you follow up with your primary doctor in 1-2 weeks to recheck your blood pressure and discuss if they need to be restarted or not.  You can continue home medications to help improve your ill-symptoms such as tylenol for muscle aches and headache, honey for cough and sore throat, mucinex or sudafed for nose drainage.

## 2023-01-23 NOTE — Progress Notes (Signed)
Patient  spo2 is 95% at rest with out oxygen. spo2 is 96% while walking without oxygen. no any symptoms of SOB.

## 2023-01-25 DIAGNOSIS — J111 Influenza due to unidentified influenza virus with other respiratory manifestations: Secondary | ICD-10-CM | POA: Diagnosis not present

## 2023-01-25 DIAGNOSIS — J4520 Mild intermittent asthma, uncomplicated: Secondary | ICD-10-CM | POA: Diagnosis not present

## 2023-01-25 DIAGNOSIS — I10 Essential (primary) hypertension: Secondary | ICD-10-CM | POA: Diagnosis not present

## 2023-01-27 ENCOUNTER — Ambulatory Visit
Admission: RE | Admit: 2023-01-27 | Discharge: 2023-01-27 | Disposition: A | Discharge status: Home / Self Care | Payer: PPO | Source: Ambulatory Visit | Attending: Internal Medicine | Admitting: Internal Medicine

## 2023-01-27 DIAGNOSIS — R1110 Vomiting, unspecified: Secondary | ICD-10-CM | POA: Diagnosis not present

## 2023-01-27 DIAGNOSIS — R1084 Generalized abdominal pain: Secondary | ICD-10-CM | POA: Diagnosis not present

## 2023-01-27 DIAGNOSIS — K760 Fatty (change of) liver, not elsewhere classified: Secondary | ICD-10-CM | POA: Diagnosis not present

## 2023-01-27 DIAGNOSIS — I700 Atherosclerosis of aorta: Secondary | ICD-10-CM | POA: Diagnosis not present

## 2023-01-27 DIAGNOSIS — R109 Unspecified abdominal pain: Secondary | ICD-10-CM | POA: Diagnosis not present

## 2023-01-27 MED ORDER — IOHEXOL 300 MG/ML  SOLN
100.0000 mL | Freq: Once | INTRAMUSCULAR | Status: AC | PRN
  Administered 2023-01-27: 100 mL via INTRAVENOUS

## 2023-02-02 DIAGNOSIS — M9902 Segmental and somatic dysfunction of thoracic region: Secondary | ICD-10-CM | POA: Diagnosis not present

## 2023-02-02 DIAGNOSIS — M9901 Segmental and somatic dysfunction of cervical region: Secondary | ICD-10-CM | POA: Diagnosis not present

## 2023-02-02 DIAGNOSIS — M62838 Other muscle spasm: Secondary | ICD-10-CM | POA: Diagnosis not present

## 2023-02-02 DIAGNOSIS — M9903 Segmental and somatic dysfunction of lumbar region: Secondary | ICD-10-CM | POA: Diagnosis not present

## 2023-02-02 DIAGNOSIS — M5416 Radiculopathy, lumbar region: Secondary | ICD-10-CM | POA: Diagnosis not present

## 2023-02-02 DIAGNOSIS — M5412 Radiculopathy, cervical region: Secondary | ICD-10-CM | POA: Diagnosis not present

## 2023-02-03 DIAGNOSIS — M48062 Spinal stenosis, lumbar region with neurogenic claudication: Secondary | ICD-10-CM | POA: Diagnosis not present

## 2023-02-03 DIAGNOSIS — M5416 Radiculopathy, lumbar region: Secondary | ICD-10-CM | POA: Diagnosis not present

## 2023-02-07 DIAGNOSIS — M5416 Radiculopathy, lumbar region: Secondary | ICD-10-CM | POA: Diagnosis not present

## 2023-02-07 DIAGNOSIS — M9901 Segmental and somatic dysfunction of cervical region: Secondary | ICD-10-CM | POA: Diagnosis not present

## 2023-02-07 DIAGNOSIS — M5412 Radiculopathy, cervical region: Secondary | ICD-10-CM | POA: Diagnosis not present

## 2023-02-07 DIAGNOSIS — M9903 Segmental and somatic dysfunction of lumbar region: Secondary | ICD-10-CM | POA: Diagnosis not present

## 2023-02-07 DIAGNOSIS — M62838 Other muscle spasm: Secondary | ICD-10-CM | POA: Diagnosis not present

## 2023-02-07 DIAGNOSIS — M9902 Segmental and somatic dysfunction of thoracic region: Secondary | ICD-10-CM | POA: Diagnosis not present

## 2023-02-09 DIAGNOSIS — M5412 Radiculopathy, cervical region: Secondary | ICD-10-CM | POA: Diagnosis not present

## 2023-02-09 DIAGNOSIS — M5416 Radiculopathy, lumbar region: Secondary | ICD-10-CM | POA: Diagnosis not present

## 2023-02-09 DIAGNOSIS — E538 Deficiency of other specified B group vitamins: Secondary | ICD-10-CM | POA: Diagnosis not present

## 2023-02-09 DIAGNOSIS — M9902 Segmental and somatic dysfunction of thoracic region: Secondary | ICD-10-CM | POA: Diagnosis not present

## 2023-02-09 DIAGNOSIS — M62838 Other muscle spasm: Secondary | ICD-10-CM | POA: Diagnosis not present

## 2023-02-09 DIAGNOSIS — M9901 Segmental and somatic dysfunction of cervical region: Secondary | ICD-10-CM | POA: Diagnosis not present

## 2023-02-09 DIAGNOSIS — M9903 Segmental and somatic dysfunction of lumbar region: Secondary | ICD-10-CM | POA: Diagnosis not present

## 2023-02-11 DIAGNOSIS — M5416 Radiculopathy, lumbar region: Secondary | ICD-10-CM | POA: Diagnosis not present

## 2023-02-11 DIAGNOSIS — M5412 Radiculopathy, cervical region: Secondary | ICD-10-CM | POA: Diagnosis not present

## 2023-02-11 DIAGNOSIS — M9901 Segmental and somatic dysfunction of cervical region: Secondary | ICD-10-CM | POA: Diagnosis not present

## 2023-02-11 DIAGNOSIS — M9902 Segmental and somatic dysfunction of thoracic region: Secondary | ICD-10-CM | POA: Diagnosis not present

## 2023-02-11 DIAGNOSIS — M62838 Other muscle spasm: Secondary | ICD-10-CM | POA: Diagnosis not present

## 2023-02-11 DIAGNOSIS — M9903 Segmental and somatic dysfunction of lumbar region: Secondary | ICD-10-CM | POA: Diagnosis not present

## 2023-02-14 DIAGNOSIS — F325 Major depressive disorder, single episode, in full remission: Secondary | ICD-10-CM | POA: Diagnosis not present

## 2023-02-14 DIAGNOSIS — M5136 Other intervertebral disc degeneration, lumbar region: Secondary | ICD-10-CM | POA: Diagnosis not present

## 2023-02-14 DIAGNOSIS — E876 Hypokalemia: Secondary | ICD-10-CM | POA: Diagnosis not present

## 2023-02-14 DIAGNOSIS — K760 Fatty (change of) liver, not elsewhere classified: Secondary | ICD-10-CM | POA: Diagnosis not present

## 2023-02-14 DIAGNOSIS — I10 Essential (primary) hypertension: Secondary | ICD-10-CM | POA: Diagnosis not present

## 2023-02-21 DIAGNOSIS — K760 Fatty (change of) liver, not elsewhere classified: Secondary | ICD-10-CM | POA: Diagnosis not present

## 2023-02-21 DIAGNOSIS — I10 Essential (primary) hypertension: Secondary | ICD-10-CM | POA: Diagnosis not present

## 2023-02-21 DIAGNOSIS — R350 Frequency of micturition: Secondary | ICD-10-CM | POA: Diagnosis not present

## 2023-02-21 DIAGNOSIS — R1032 Left lower quadrant pain: Secondary | ICD-10-CM | POA: Diagnosis not present

## 2023-02-21 DIAGNOSIS — E876 Hypokalemia: Secondary | ICD-10-CM | POA: Diagnosis not present

## 2023-03-01 DIAGNOSIS — I10 Essential (primary) hypertension: Secondary | ICD-10-CM | POA: Diagnosis not present

## 2023-03-14 DIAGNOSIS — E538 Deficiency of other specified B group vitamins: Secondary | ICD-10-CM | POA: Diagnosis not present

## 2023-04-15 DIAGNOSIS — Z79891 Long term (current) use of opiate analgesic: Secondary | ICD-10-CM | POA: Diagnosis not present

## 2023-04-15 DIAGNOSIS — M5136 Other intervertebral disc degeneration, lumbar region: Secondary | ICD-10-CM | POA: Diagnosis not present

## 2023-04-15 DIAGNOSIS — E538 Deficiency of other specified B group vitamins: Secondary | ICD-10-CM | POA: Diagnosis not present

## 2023-04-15 DIAGNOSIS — I10 Essential (primary) hypertension: Secondary | ICD-10-CM | POA: Diagnosis not present

## 2023-04-15 DIAGNOSIS — E7849 Other hyperlipidemia: Secondary | ICD-10-CM | POA: Diagnosis not present

## 2023-04-15 DIAGNOSIS — E034 Atrophy of thyroid (acquired): Secondary | ICD-10-CM | POA: Diagnosis not present

## 2023-04-22 DIAGNOSIS — Z79891 Long term (current) use of opiate analgesic: Secondary | ICD-10-CM | POA: Diagnosis not present

## 2023-04-22 DIAGNOSIS — E7849 Other hyperlipidemia: Secondary | ICD-10-CM | POA: Diagnosis not present

## 2023-04-22 DIAGNOSIS — F325 Major depressive disorder, single episode, in full remission: Secondary | ICD-10-CM | POA: Diagnosis not present

## 2023-04-22 DIAGNOSIS — L989 Disorder of the skin and subcutaneous tissue, unspecified: Secondary | ICD-10-CM | POA: Diagnosis not present

## 2023-04-22 DIAGNOSIS — I700 Atherosclerosis of aorta: Secondary | ICD-10-CM | POA: Diagnosis not present

## 2023-04-22 DIAGNOSIS — M5136 Other intervertebral disc degeneration, lumbar region: Secondary | ICD-10-CM | POA: Diagnosis not present

## 2023-04-22 DIAGNOSIS — E538 Deficiency of other specified B group vitamins: Secondary | ICD-10-CM | POA: Diagnosis not present

## 2023-04-22 DIAGNOSIS — J4520 Mild intermittent asthma, uncomplicated: Secondary | ICD-10-CM | POA: Diagnosis not present

## 2023-04-22 DIAGNOSIS — E034 Atrophy of thyroid (acquired): Secondary | ICD-10-CM | POA: Diagnosis not present

## 2023-04-22 DIAGNOSIS — G4733 Obstructive sleep apnea (adult) (pediatric): Secondary | ICD-10-CM | POA: Diagnosis not present

## 2023-04-22 DIAGNOSIS — Z0000 Encounter for general adult medical examination without abnormal findings: Secondary | ICD-10-CM | POA: Diagnosis not present

## 2023-04-22 DIAGNOSIS — I10 Essential (primary) hypertension: Secondary | ICD-10-CM | POA: Diagnosis not present

## 2023-05-11 DIAGNOSIS — E034 Atrophy of thyroid (acquired): Secondary | ICD-10-CM | POA: Diagnosis not present

## 2023-05-16 DIAGNOSIS — E538 Deficiency of other specified B group vitamins: Secondary | ICD-10-CM | POA: Diagnosis not present

## 2023-05-25 DIAGNOSIS — R21 Rash and other nonspecific skin eruption: Secondary | ICD-10-CM | POA: Diagnosis not present

## 2023-05-25 DIAGNOSIS — Z23 Encounter for immunization: Secondary | ICD-10-CM | POA: Diagnosis not present

## 2023-05-25 DIAGNOSIS — Z79891 Long term (current) use of opiate analgesic: Secondary | ICD-10-CM | POA: Diagnosis not present

## 2023-05-25 DIAGNOSIS — I10 Essential (primary) hypertension: Secondary | ICD-10-CM | POA: Diagnosis not present

## 2023-05-25 DIAGNOSIS — J4520 Mild intermittent asthma, uncomplicated: Secondary | ICD-10-CM | POA: Diagnosis not present

## 2023-05-25 DIAGNOSIS — K760 Fatty (change of) liver, not elsewhere classified: Secondary | ICD-10-CM | POA: Diagnosis not present

## 2023-05-25 DIAGNOSIS — I700 Atherosclerosis of aorta: Secondary | ICD-10-CM | POA: Diagnosis not present

## 2023-05-25 DIAGNOSIS — D692 Other nonthrombocytopenic purpura: Secondary | ICD-10-CM | POA: Diagnosis not present

## 2023-05-25 DIAGNOSIS — F325 Major depressive disorder, single episode, in full remission: Secondary | ICD-10-CM | POA: Diagnosis not present

## 2023-05-25 DIAGNOSIS — S40861A Insect bite (nonvenomous) of right upper arm, initial encounter: Secondary | ICD-10-CM | POA: Diagnosis not present

## 2023-06-16 DIAGNOSIS — K760 Fatty (change of) liver, not elsewhere classified: Secondary | ICD-10-CM | POA: Diagnosis not present

## 2023-06-16 DIAGNOSIS — E538 Deficiency of other specified B group vitamins: Secondary | ICD-10-CM | POA: Diagnosis not present

## 2023-06-16 DIAGNOSIS — T148XXA Other injury of unspecified body region, initial encounter: Secondary | ICD-10-CM | POA: Diagnosis not present

## 2023-06-16 DIAGNOSIS — E034 Atrophy of thyroid (acquired): Secondary | ICD-10-CM | POA: Diagnosis not present

## 2023-06-16 DIAGNOSIS — R21 Rash and other nonspecific skin eruption: Secondary | ICD-10-CM | POA: Diagnosis not present

## 2023-07-25 DIAGNOSIS — E538 Deficiency of other specified B group vitamins: Secondary | ICD-10-CM | POA: Diagnosis not present

## 2023-08-01 DIAGNOSIS — M47812 Spondylosis without myelopathy or radiculopathy, cervical region: Secondary | ICD-10-CM | POA: Diagnosis not present

## 2023-08-19 DIAGNOSIS — M9902 Segmental and somatic dysfunction of thoracic region: Secondary | ICD-10-CM | POA: Diagnosis not present

## 2023-08-19 DIAGNOSIS — M9901 Segmental and somatic dysfunction of cervical region: Secondary | ICD-10-CM | POA: Diagnosis not present

## 2023-08-19 DIAGNOSIS — M5412 Radiculopathy, cervical region: Secondary | ICD-10-CM | POA: Diagnosis not present

## 2023-08-19 DIAGNOSIS — M9903 Segmental and somatic dysfunction of lumbar region: Secondary | ICD-10-CM | POA: Diagnosis not present

## 2023-08-19 DIAGNOSIS — M5416 Radiculopathy, lumbar region: Secondary | ICD-10-CM | POA: Diagnosis not present

## 2023-08-19 DIAGNOSIS — M62838 Other muscle spasm: Secondary | ICD-10-CM | POA: Diagnosis not present

## 2023-08-22 DIAGNOSIS — I10 Essential (primary) hypertension: Secondary | ICD-10-CM | POA: Diagnosis not present

## 2023-08-22 DIAGNOSIS — D692 Other nonthrombocytopenic purpura: Secondary | ICD-10-CM | POA: Diagnosis not present

## 2023-08-22 DIAGNOSIS — M51362 Other intervertebral disc degeneration, lumbar region with discogenic back pain and lower extremity pain: Secondary | ICD-10-CM | POA: Diagnosis not present

## 2023-08-22 DIAGNOSIS — Z23 Encounter for immunization: Secondary | ICD-10-CM | POA: Diagnosis not present

## 2023-08-25 DIAGNOSIS — E538 Deficiency of other specified B group vitamins: Secondary | ICD-10-CM | POA: Diagnosis not present

## 2023-08-30 DIAGNOSIS — M48062 Spinal stenosis, lumbar region with neurogenic claudication: Secondary | ICD-10-CM | POA: Diagnosis not present

## 2023-08-30 DIAGNOSIS — M5416 Radiculopathy, lumbar region: Secondary | ICD-10-CM | POA: Diagnosis not present

## 2023-09-14 DIAGNOSIS — M5416 Radiculopathy, lumbar region: Secondary | ICD-10-CM | POA: Diagnosis not present

## 2023-09-14 DIAGNOSIS — M48062 Spinal stenosis, lumbar region with neurogenic claudication: Secondary | ICD-10-CM | POA: Diagnosis not present

## 2023-09-14 DIAGNOSIS — M47812 Spondylosis without myelopathy or radiculopathy, cervical region: Secondary | ICD-10-CM | POA: Diagnosis not present

## 2023-09-16 ENCOUNTER — Other Ambulatory Visit: Payer: Self-pay | Admitting: Physical Medicine and Rehabilitation

## 2023-09-16 DIAGNOSIS — M5416 Radiculopathy, lumbar region: Secondary | ICD-10-CM

## 2023-09-20 ENCOUNTER — Ambulatory Visit
Admission: RE | Admit: 2023-09-20 | Discharge: 2023-09-20 | Disposition: A | Discharge status: Home / Self Care | Payer: PPO | Source: Ambulatory Visit | Attending: Physical Medicine and Rehabilitation | Admitting: Physical Medicine and Rehabilitation

## 2023-09-20 DIAGNOSIS — M5126 Other intervertebral disc displacement, lumbar region: Secondary | ICD-10-CM | POA: Diagnosis not present

## 2023-09-20 DIAGNOSIS — M4316 Spondylolisthesis, lumbar region: Secondary | ICD-10-CM | POA: Diagnosis not present

## 2023-09-20 DIAGNOSIS — M5416 Radiculopathy, lumbar region: Secondary | ICD-10-CM

## 2023-09-20 DIAGNOSIS — M47816 Spondylosis without myelopathy or radiculopathy, lumbar region: Secondary | ICD-10-CM | POA: Diagnosis not present

## 2023-09-20 DIAGNOSIS — Z981 Arthrodesis status: Secondary | ICD-10-CM | POA: Diagnosis not present

## 2023-09-26 DIAGNOSIS — E538 Deficiency of other specified B group vitamins: Secondary | ICD-10-CM | POA: Diagnosis not present

## 2023-09-29 DIAGNOSIS — I700 Atherosclerosis of aorta: Secondary | ICD-10-CM | POA: Diagnosis not present

## 2023-09-29 DIAGNOSIS — M51362 Other intervertebral disc degeneration, lumbar region with discogenic back pain and lower extremity pain: Secondary | ICD-10-CM | POA: Diagnosis not present

## 2023-09-29 DIAGNOSIS — J4520 Mild intermittent asthma, uncomplicated: Secondary | ICD-10-CM | POA: Diagnosis not present

## 2023-09-29 DIAGNOSIS — F325 Major depressive disorder, single episode, in full remission: Secondary | ICD-10-CM | POA: Diagnosis not present

## 2023-09-29 DIAGNOSIS — N481 Balanitis: Secondary | ICD-10-CM | POA: Diagnosis not present

## 2023-09-29 DIAGNOSIS — I10 Essential (primary) hypertension: Secondary | ICD-10-CM | POA: Diagnosis not present

## 2023-09-29 DIAGNOSIS — D692 Other nonthrombocytopenic purpura: Secondary | ICD-10-CM | POA: Diagnosis not present

## 2023-09-29 DIAGNOSIS — Z2821 Immunization not carried out because of patient refusal: Secondary | ICD-10-CM | POA: Diagnosis not present

## 2023-09-29 DIAGNOSIS — Z79891 Long term (current) use of opiate analgesic: Secondary | ICD-10-CM | POA: Diagnosis not present

## 2023-10-10 DIAGNOSIS — M5441 Lumbago with sciatica, right side: Secondary | ICD-10-CM | POA: Diagnosis not present

## 2023-10-10 DIAGNOSIS — G8929 Other chronic pain: Secondary | ICD-10-CM | POA: Diagnosis not present

## 2023-10-14 DIAGNOSIS — M48062 Spinal stenosis, lumbar region with neurogenic claudication: Secondary | ICD-10-CM | POA: Diagnosis not present

## 2023-10-17 ENCOUNTER — Other Ambulatory Visit: Payer: Self-pay | Admitting: Student

## 2023-10-17 DIAGNOSIS — E538 Deficiency of other specified B group vitamins: Secondary | ICD-10-CM | POA: Diagnosis not present

## 2023-10-17 DIAGNOSIS — I10 Essential (primary) hypertension: Secondary | ICD-10-CM | POA: Diagnosis not present

## 2023-10-17 DIAGNOSIS — M48062 Spinal stenosis, lumbar region with neurogenic claudication: Secondary | ICD-10-CM

## 2023-10-17 DIAGNOSIS — E034 Atrophy of thyroid (acquired): Secondary | ICD-10-CM | POA: Diagnosis not present

## 2023-10-17 DIAGNOSIS — M51369 Other intervertebral disc degeneration, lumbar region without mention of lumbar back pain or lower extremity pain: Secondary | ICD-10-CM | POA: Diagnosis not present

## 2023-10-17 DIAGNOSIS — E7849 Other hyperlipidemia: Secondary | ICD-10-CM | POA: Diagnosis not present

## 2023-10-17 DIAGNOSIS — Z79891 Long term (current) use of opiate analgesic: Secondary | ICD-10-CM | POA: Diagnosis not present

## 2023-10-27 DIAGNOSIS — F325 Major depressive disorder, single episode, in full remission: Secondary | ICD-10-CM | POA: Diagnosis not present

## 2023-10-27 DIAGNOSIS — G4733 Obstructive sleep apnea (adult) (pediatric): Secondary | ICD-10-CM | POA: Diagnosis not present

## 2023-10-27 DIAGNOSIS — E7849 Other hyperlipidemia: Secondary | ICD-10-CM | POA: Diagnosis not present

## 2023-10-27 DIAGNOSIS — I700 Atherosclerosis of aorta: Secondary | ICD-10-CM | POA: Diagnosis not present

## 2023-10-27 DIAGNOSIS — E538 Deficiency of other specified B group vitamins: Secondary | ICD-10-CM | POA: Diagnosis not present

## 2023-10-27 DIAGNOSIS — Z2821 Immunization not carried out because of patient refusal: Secondary | ICD-10-CM | POA: Diagnosis not present

## 2023-10-27 DIAGNOSIS — M51362 Other intervertebral disc degeneration, lumbar region with discogenic back pain and lower extremity pain: Secondary | ICD-10-CM | POA: Diagnosis not present

## 2023-10-27 DIAGNOSIS — J4520 Mild intermittent asthma, uncomplicated: Secondary | ICD-10-CM | POA: Diagnosis not present

## 2023-10-27 DIAGNOSIS — D692 Other nonthrombocytopenic purpura: Secondary | ICD-10-CM | POA: Diagnosis not present

## 2023-10-27 DIAGNOSIS — E034 Atrophy of thyroid (acquired): Secondary | ICD-10-CM | POA: Diagnosis not present

## 2023-10-27 DIAGNOSIS — Z79891 Long term (current) use of opiate analgesic: Secondary | ICD-10-CM | POA: Diagnosis not present

## 2023-10-27 DIAGNOSIS — I10 Essential (primary) hypertension: Secondary | ICD-10-CM | POA: Diagnosis not present

## 2023-10-27 NOTE — Discharge Instructions (Signed)

## 2023-10-28 ENCOUNTER — Ambulatory Visit
Admission: RE | Admit: 2023-10-28 | Discharge: 2023-10-28 | Disposition: A | Discharge status: Home / Self Care | Payer: PPO | Source: Ambulatory Visit | Attending: Student | Admitting: Student

## 2023-10-28 DIAGNOSIS — M48062 Spinal stenosis, lumbar region with neurogenic claudication: Secondary | ICD-10-CM

## 2023-10-28 DIAGNOSIS — Z981 Arthrodesis status: Secondary | ICD-10-CM | POA: Diagnosis not present

## 2023-10-28 DIAGNOSIS — M5126 Other intervertebral disc displacement, lumbar region: Secondary | ICD-10-CM | POA: Diagnosis not present

## 2023-10-28 DIAGNOSIS — M48061 Spinal stenosis, lumbar region without neurogenic claudication: Secondary | ICD-10-CM | POA: Diagnosis not present

## 2023-10-28 MED ORDER — MEPERIDINE HCL 50 MG/ML IJ SOLN: 50.0000 mg | Freq: Once | INTRAMUSCULAR | Status: DC | PRN

## 2023-10-28 MED ORDER — DIAZEPAM 5 MG PO TABS: 5.0000 mg | ORAL_TABLET | Freq: Once | ORAL | Status: DC

## 2023-10-28 MED ORDER — IOPAMIDOL (ISOVUE-M 200) INJECTION 41%
20.0000 mL | Freq: Once | INTRAMUSCULAR | Status: AC
  Administered 2023-10-28: 20 mL via INTRATHECAL

## 2023-10-28 MED ORDER — ONDANSETRON HCL 4 MG/2ML IJ SOLN: 4.0000 mg | Freq: Once | INTRAMUSCULAR | Status: DC | PRN

## 2023-10-28 NOTE — Progress Notes (Signed)
 Post myelo

## 2023-11-03 DIAGNOSIS — M51369 Other intervertebral disc degeneration, lumbar region without mention of lumbar back pain or lower extremity pain: Secondary | ICD-10-CM | POA: Diagnosis not present

## 2023-11-03 DIAGNOSIS — M4316 Spondylolisthesis, lumbar region: Secondary | ICD-10-CM | POA: Diagnosis not present

## 2023-11-03 DIAGNOSIS — Z6829 Body mass index (BMI) 29.0-29.9, adult: Secondary | ICD-10-CM | POA: Diagnosis not present

## 2023-11-03 DIAGNOSIS — M48062 Spinal stenosis, lumbar region with neurogenic claudication: Secondary | ICD-10-CM | POA: Diagnosis not present

## 2023-11-08 ENCOUNTER — Other Ambulatory Visit: Payer: Self-pay | Admitting: Neurosurgery

## 2023-11-08 DIAGNOSIS — H2513 Age-related nuclear cataract, bilateral: Secondary | ICD-10-CM | POA: Diagnosis not present

## 2023-11-24 NOTE — Pre-Procedure Instructions (Signed)
 Surgical Instructions   Your procedure is scheduled on Thursday, April 10th. Report to [facility] Main Entrance "A" at 10:20 A.M., then check in with the Admitting office. Any questions or running late day of surgery: call [phone]  Questions prior to your surgery date: call [phone], Monday-Friday, 8am-4pm. If you experience any cold or flu symptoms such as cough, fever, chills, shortness of breath, etc. between now and your scheduled surgery, please notify us at the above number.     Remember:  Do not eat or drink after midnight the night before your surgery    Take these medicines the morning of surgery with A SIP OF WATER  fluticasone (FLONASE)  levothyroxine (SYNTHROID)  lovastatin (MEVACOR)  pantoprazole (PROTONIX)   May take these medicines IF NEEDED: sertraline (ZOLOFT)  tiZANidine (ZANAFLEX)  traMADol (ULTRAM)   STOP taking Aspirin 5 days prior to surgery. Last dose 4/4.  One week prior to surgery, STOP taking any Aleve, Naproxen, Ibuprofen, Motrin, Advil, Goody's, BC's, all herbal medications, fish oil, and non-prescription vitamins.                     Do NOT Smoke (Tobacco/Vaping) for 24 hours prior to your procedure.  If you use a CPAP at night, you may bring your mask/headgear for your overnight stay.   You will be asked to remove any contacts, glasses, piercing's, hearing aid's, dentures/partials prior to surgery. Please bring cases for these items if needed.    Patients discharged the day of surgery will not be allowed to drive home, and someone needs to stay with them for 24 hours.  SURGICAL WAITING ROOM VISITATION Patients may have no more than 2 support people in the waiting area - these visitors may rotate.   Pre-op nurse will coordinate an appropriate time for 1 ADULT support person, who may not rotate, to accompany patient in pre-op.  Children under the age of 44 must have an adult with them who is not the patient and must remain in the main  waiting area with an adult.  If the patient needs to stay at the hospital during part of their recovery, the visitor guidelines for inpatient rooms apply.  Please refer to the [facility] website for the visitor guidelines for any additional information.   If you received a COVID test during your pre-op visit  it is requested that you wear a mask when out in public, stay away from anyone that may not be feeling well and notify your surgeon if you develop symptoms. If you have been in contact with anyone that has tested positive in the last 10 days please notify you surgeon.      Pre-operative 5 CHG Bathing Instructions   You can play a key role in reducing the risk of infection after surgery. Your skin needs to be as free of germs as possible. You can reduce the number of germs on your skin by washing with CHG (chlorhexidine gluconate) soap before surgery. CHG is an antiseptic soap that kills germs and continues to kill germs even after washing.   DO NOT use if you have an allergy to chlorhexidine/CHG or antibacterial soaps. If your skin becomes reddened or irritated, stop using the CHG and notify one of our RNs at [phone].   Please shower with the CHG soap starting 4 days before surgery using the following schedule:     Please keep in mind the following:  DO NOT shave, including legs and underarms, starting the day  of your first shower.   You may shave your face at any point before/day of surgery.  Place clean sheets on your bed the day you start using CHG soap. Use a clean washcloth (not used since being washed) for each shower. DO NOT sleep with pets once you start using the CHG.   CHG Shower Instructions:  Wash your face and private area with normal soap. If you choose to wash your hair, wash first with your normal shampoo.  After you use shampoo/soap, rinse your hair and body thoroughly to remove shampoo/soap residue.  Turn the water OFF and apply about 3 tablespoons (45 ml)  of CHG soap to a CLEAN washcloth.  Apply CHG soap ONLY FROM YOUR NECK DOWN TO YOUR TOES (washing for 3-5 minutes)  DO NOT use CHG soap on face, private areas, open wounds, or sores.  Pay special attention to the area where your surgery is being performed.  If you are having back surgery, having someone wash your back for you may be helpful. Wait 2 minutes after CHG soap is applied, then you may rinse off the CHG soap.  Pat dry with a clean towel  Put on clean clothes/pajamas   If you choose to wear lotion, please use ONLY the CHG-compatible lotions that are listed below.  Additional instructions for the day of surgery: DO NOT APPLY any lotions, deodorants, cologne, or perfumes.   Do not bring valuables to the hospital. [facility] is not responsible for any belongings/valuables. Do not wear nail polish, gel polish, artificial nails, or any other type of covering on natural nails (fingers and toes) Do not wear jewelry or makeup Put on clean/comfortable clothes.  Please brush your teeth.  Ask your nurse before applying any prescription medications to the skin.     CHG Compatible Lotions   Aveeno Moisturizing lotion  Cetaphil Moisturizing Cream  Cetaphil Moisturizing Lotion  Clairol Herbal Essence Moisturizing Lotion, Dry Skin  Clairol Herbal Essence Moisturizing Lotion, Extra Dry Skin  Clairol Herbal Essence Moisturizing Lotion, Normal Skin  Curel Age Defying Therapeutic Moisturizing Lotion with Alpha Hydroxy  Curel Extreme Care Body Lotion  Curel Soothing Hands Moisturizing Hand Lotion  Curel Therapeutic Moisturizing Cream, Fragrance-Free  Curel Therapeutic Moisturizing Lotion, Fragrance-Free  Curel Therapeutic Moisturizing Lotion, Original Formula  Eucerin Daily Replenishing Lotion  Eucerin Dry Skin Therapy Plus Alpha Hydroxy Crme  Eucerin Dry Skin Therapy Plus Alpha Hydroxy Lotion  Eucerin Original Crme  Eucerin Original Lotion  Eucerin Plus Crme Eucerin Plus Lotion   Eucerin TriLipid Replenishing Lotion  Keri Anti-Bacterial Hand Lotion  Keri Deep Conditioning Original Lotion Dry Skin Formula Softly Scented  Keri Deep Conditioning Original Lotion, Fragrance Free Sensitive Skin Formula  Keri Lotion Fast Absorbing Fragrance Free Sensitive Skin Formula  Keri Lotion Fast Absorbing Softly Scented Dry Skin Formula  Keri Original Lotion  Keri Skin Renewal Lotion Keri Silky Smooth Lotion  Keri Silky Smooth Sensitive Skin Lotion  Nivea Body Creamy Conditioning Oil  Nivea Body Extra Enriched Lotion  Nivea Body Original Lotion  Nivea Body Sheer Moisturizing Lotion Nivea Crme  Nivea Skin Firming Lotion  NutraDerm 30 Skin Lotion  NutraDerm Skin Lotion  NutraDerm Therapeutic Skin Cream  NutraDerm Therapeutic Skin Lotion  ProShield Protective Hand Cream  Provon moisturizing lotion  Please read over the following fact sheets that you were given.

## 2023-11-25 ENCOUNTER — Encounter (HOSPITAL_COMMUNITY): Payer: Self-pay

## 2023-11-25 ENCOUNTER — Encounter (HOSPITAL_COMMUNITY)
Admission: RE | Admit: 2023-11-25 | Discharge: 2023-11-25 | Disposition: A | Discharge status: Home / Self Care | Source: Ambulatory Visit | Attending: Neurosurgery | Admitting: Neurosurgery

## 2023-11-25 ENCOUNTER — Other Ambulatory Visit: Payer: Self-pay

## 2023-11-25 VITALS — BP 157/85 | HR 60 | Temp 97.9°F | Resp 17 | Ht 67.0 in | Wt 179.4 lb

## 2023-11-25 DIAGNOSIS — Z01818 Encounter for other preprocedural examination: Secondary | ICD-10-CM

## 2023-11-25 DIAGNOSIS — K760 Fatty (change of) liver, not elsewhere classified: Secondary | ICD-10-CM

## 2023-11-25 DIAGNOSIS — Z01812 Encounter for preprocedural laboratory examination: Secondary | ICD-10-CM | POA: Diagnosis not present

## 2023-11-25 HISTORY — DX: Depression, unspecified: F32.A

## 2023-11-25 HISTORY — DX: Fatty (change of) liver, not elsewhere classified: K76.0

## 2023-11-25 LAB — TYPE AND SCREEN
ABO/RH(D): O POS
Antibody Screen: NEGATIVE

## 2023-11-25 LAB — SURGICAL PCR SCREEN
MRSA, PCR: NEGATIVE
Staphylococcus aureus: NEGATIVE

## 2023-11-25 LAB — COMPREHENSIVE METABOLIC PANEL WITH GFR
ALT: 18 U/L (ref 0–44)
AST: 21 U/L (ref 15–41)
Albumin: 4 g/dL (ref 3.5–5.0)
Alkaline Phosphatase: 62 U/L (ref 38–126)
Anion gap: 7 (ref 5–15)
BUN: 17 mg/dL (ref 8–23)
CO2: 29 mmol/L (ref 22–32)
Calcium: 10.4 mg/dL — ABNORMAL HIGH (ref 8.9–10.3)
Chloride: 103 mmol/L (ref 98–111)
Creatinine, Ser: 1.07 mg/dL (ref 0.61–1.24)
GFR, Estimated: >60 mL/min (ref >60)
Glucose, Bld: 107 mg/dL — ABNORMAL HIGH (ref 70–99)
Potassium: 5 mmol/L (ref 3.5–5.1)
Sodium: 139 mmol/L (ref 135–145)
Total Bilirubin: 1 mg/dL (ref 0.0–1.2)
Total Protein: 6.8 g/dL (ref 6.5–8.1)

## 2023-11-25 LAB — CBC
HCT: 45.2 % (ref 39.0–52.0)
Hemoglobin: 15 g/dL (ref 13.0–17.0)
MCH: 30.5 pg (ref 26.0–34.0)
MCHC: 33.2 g/dL (ref 30.0–36.0)
MCV: 91.9 fL (ref 80.0–100.0)
Platelets: 192 10*3/uL (ref 150–400)
RBC: 4.92 MIL/uL (ref 4.22–5.81)
RDW: 12.7 % (ref 11.5–15.5)
WBC: 5.3 10*3/uL (ref 4.0–10.5)
nRBC: 0 % (ref 0.0–0.2)

## 2023-11-25 NOTE — Progress Notes (Signed)
 PCP - Dr. Daniel Nones III Cardiologist - denies  PPM/ICD - denies   Chest x-ray - 01/21/23 EKG - 01/21/23 Stress Test - 11/18/20 ECHO - 11/18/20 Cardiac Cath - denies  Sleep Study - OSA+ CPAP - denies  DM- denies  Last dose of GLP1 agonist-  n/a   Blood Thinner Instructions: n/a Aspirin Instructions: Hold 5 days. Last dose 4/4  ERAS Protcol - no, NPO   COVID TEST- n/a   Anesthesia review: no  Patient denies shortness of breath, fever, cough and chest pain at PAT appointment   All instructions explained to the patient, with a verbal understanding of the material. Patient agrees to go over the instructions while at home for a better understanding.  The opportunity to ask questions was provided.

## 2023-12-08 ENCOUNTER — Ambulatory Visit (HOSPITAL_COMMUNITY)

## 2023-12-08 ENCOUNTER — Other Ambulatory Visit: Payer: Self-pay

## 2023-12-08 ENCOUNTER — Encounter (HOSPITAL_COMMUNITY): Payer: Self-pay | Admitting: Neurosurgery

## 2023-12-08 ENCOUNTER — Ambulatory Visit (HOSPITAL_BASED_OUTPATIENT_CLINIC_OR_DEPARTMENT_OTHER)

## 2023-12-08 ENCOUNTER — Ambulatory Visit (HOSPITAL_COMMUNITY)
Admission: RE | Admit: 2023-12-08 | Discharge: 2023-12-09 | Disposition: A | Discharge status: Home / Self Care | Source: Ambulatory Visit | Attending: Neurosurgery | Admitting: Neurosurgery

## 2023-12-08 ENCOUNTER — Encounter (HOSPITAL_COMMUNITY)
Admission: RE | Disposition: A | Discharge status: Home / Self Care | Payer: Self-pay | Source: Ambulatory Visit | Attending: Neurosurgery

## 2023-12-08 DIAGNOSIS — M51369 Other intervertebral disc degeneration, lumbar region without mention of lumbar back pain or lower extremity pain: Secondary | ICD-10-CM | POA: Diagnosis not present

## 2023-12-08 DIAGNOSIS — K219 Gastro-esophageal reflux disease without esophagitis: Secondary | ICD-10-CM | POA: Diagnosis not present

## 2023-12-08 DIAGNOSIS — M48062 Spinal stenosis, lumbar region with neurogenic claudication: Secondary | ICD-10-CM | POA: Insufficient documentation

## 2023-12-08 DIAGNOSIS — M5116 Intervertebral disc disorders with radiculopathy, lumbar region: Secondary | ICD-10-CM | POA: Insufficient documentation

## 2023-12-08 DIAGNOSIS — M4316 Spondylolisthesis, lumbar region: Secondary | ICD-10-CM | POA: Insufficient documentation

## 2023-12-08 DIAGNOSIS — E039 Hypothyroidism, unspecified: Secondary | ICD-10-CM | POA: Diagnosis not present

## 2023-12-08 DIAGNOSIS — J45909 Unspecified asthma, uncomplicated: Secondary | ICD-10-CM

## 2023-12-08 DIAGNOSIS — I10 Essential (primary) hypertension: Secondary | ICD-10-CM | POA: Diagnosis not present

## 2023-12-08 DIAGNOSIS — G4730 Sleep apnea, unspecified: Secondary | ICD-10-CM | POA: Diagnosis not present

## 2023-12-08 DIAGNOSIS — Z0189 Encounter for other specified special examinations: Secondary | ICD-10-CM | POA: Diagnosis not present

## 2023-12-08 DIAGNOSIS — I700 Atherosclerosis of aorta: Secondary | ICD-10-CM | POA: Diagnosis not present

## 2023-12-08 DIAGNOSIS — Z981 Arthrodesis status: Secondary | ICD-10-CM | POA: Diagnosis not present

## 2023-12-08 SURGERY — POSTERIOR LUMBAR FUSION 1 LEVEL
Anesthesia: General

## 2023-12-08 MED ORDER — FENTANYL CITRATE (PF) 100 MCG/2ML IJ SOLN
INTRAMUSCULAR | Status: AC
  Filled 2023-12-08: qty 2

## 2023-12-08 MED ORDER — PROPOFOL 10 MG/ML IV BOLUS
INTRAVENOUS | Status: DC | PRN
  Administered 2023-12-08: 130 mg via INTRAVENOUS

## 2023-12-08 MED ORDER — PROPOFOL 10 MG/ML IV BOLUS
INTRAVENOUS | Status: AC
  Filled 2023-12-08: qty 20

## 2023-12-08 MED ORDER — LEVOTHYROXINE SODIUM 100 MCG PO TABS
100.0000 ug | ORAL_TABLET | Freq: Every day | ORAL | Status: DC
  Administered 2023-12-09: 100 ug via ORAL
  Filled 2023-12-08: qty 1

## 2023-12-08 MED ORDER — OXYCODONE HCL 5 MG PO TABS
ORAL_TABLET | ORAL | Status: AC
  Filled 2023-12-08: qty 1

## 2023-12-08 MED ORDER — ACETAMINOPHEN 500 MG PO TABS
1000.0000 mg | ORAL_TABLET | Freq: Four times a day (QID) | ORAL | Status: DC
  Administered 2023-12-08: 1000 mg via ORAL
  Filled 2023-12-08: qty 2

## 2023-12-08 MED ORDER — THROMBIN 5000 UNITS EX SOLR: OROMUCOSAL | Status: DC | PRN

## 2023-12-08 MED ORDER — AMISULPRIDE (ANTIEMETIC) 5 MG/2ML IV SOLN: 10.0000 mg | Freq: Once | INTRAVENOUS | Status: DC | PRN

## 2023-12-08 MED ORDER — PANTOPRAZOLE SODIUM 40 MG PO TBEC
40.0000 mg | DELAYED_RELEASE_TABLET | Freq: Every day | ORAL | Status: DC
  Administered 2023-12-08 – 2023-12-09 (×2): 40 mg via ORAL
  Filled 2023-12-08 (×2): qty 1

## 2023-12-08 MED ORDER — HYDROCODONE-ACETAMINOPHEN 10-325 MG PO TABS
1.0000 | ORAL_TABLET | ORAL | Status: DC | PRN
  Administered 2023-12-08 – 2023-12-09 (×3): 1 via ORAL
  Filled 2023-12-08 (×3): qty 1

## 2023-12-08 MED ORDER — AMLODIPINE BESYLATE 5 MG PO TABS
2.5000 mg | ORAL_TABLET | Freq: Every day | ORAL | Status: DC
  Administered 2023-12-09: 2.5 mg via ORAL
  Filled 2023-12-08: qty 1

## 2023-12-08 MED ORDER — ROCURONIUM BROMIDE 10 MG/ML (PF) SYRINGE
PREFILLED_SYRINGE | INTRAVENOUS | Status: DC | PRN
  Administered 2023-12-08: 10 mg via INTRAVENOUS
  Administered 2023-12-08 (×3): 20 mg via INTRAVENOUS
  Administered 2023-12-08: 50 mg via INTRAVENOUS

## 2023-12-08 MED ORDER — LIDOCAINE 2% (20 MG/ML) 5 ML SYRINGE
INTRAMUSCULAR | Status: DC | PRN
  Administered 2023-12-08: 60 mg via INTRAVENOUS

## 2023-12-08 MED ORDER — SODIUM CHLORIDE 0.9% FLUSH
3.0000 mL | Freq: Two times a day (BID) | INTRAVENOUS | Status: DC
  Administered 2023-12-08: 3 mL via INTRAVENOUS

## 2023-12-08 MED ORDER — OXYCODONE HCL 5 MG PO TABS
5.0000 mg | ORAL_TABLET | Freq: Once | ORAL | Status: AC | PRN
  Administered 2023-12-08: 5 mg via ORAL

## 2023-12-08 MED ORDER — CHLORHEXIDINE GLUCONATE CLOTH 2 % EX PADS: 6.0000 | MEDICATED_PAD | Freq: Once | CUTANEOUS | Status: DC

## 2023-12-08 MED ORDER — BUPIVACAINE-EPINEPHRINE (PF) 0.5% -1:200000 IJ SOLN
INTRAMUSCULAR | Status: AC
  Filled 2023-12-08: qty 30

## 2023-12-08 MED ORDER — EPHEDRINE SULFATE-NACL 50-0.9 MG/10ML-% IV SOSY
PREFILLED_SYRINGE | INTRAVENOUS | Status: DC | PRN
  Administered 2023-12-08 (×3): 10 mg via INTRAVENOUS
  Administered 2023-12-08: 15 mg via INTRAVENOUS
  Administered 2023-12-08: 10 mg via INTRAVENOUS
  Administered 2023-12-08: 5 mg via INTRAVENOUS

## 2023-12-08 MED ORDER — FENTANYL CITRATE (PF) 100 MCG/2ML IJ SOLN
25.0000 ug | INTRAMUSCULAR | Status: DC | PRN
  Administered 2023-12-08: 25 ug via INTRAVENOUS
  Administered 2023-12-08: 50 ug via INTRAVENOUS
  Administered 2023-12-08: 25 ug via INTRAVENOUS

## 2023-12-08 MED ORDER — BUPIVACAINE-EPINEPHRINE (PF) 0.5% -1:200000 IJ SOLN
INTRAMUSCULAR | Status: DC | PRN
  Administered 2023-12-08: 10 mL

## 2023-12-08 MED ORDER — OXYCODONE HCL 5 MG PO TABS: 10.0000 mg | ORAL_TABLET | ORAL | Status: DC | PRN

## 2023-12-08 MED ORDER — POTASSIUM CHLORIDE CRYS ER 10 MEQ PO TBCR
10.0000 meq | EXTENDED_RELEASE_TABLET | Freq: Every day | ORAL | Status: DC
  Administered 2023-12-08 – 2023-12-09 (×2): 10 meq via ORAL
  Filled 2023-12-08 (×2): qty 1

## 2023-12-08 MED ORDER — ONDANSETRON HCL 4 MG PO TABS: 4.0000 mg | ORAL_TABLET | Freq: Four times a day (QID) | ORAL | Status: DC | PRN

## 2023-12-08 MED ORDER — SODIUM CHLORIDE 0.9% FLUSH: 3.0000 mL | INTRAVENOUS | Status: DC | PRN

## 2023-12-08 MED ORDER — ACETAMINOPHEN 500 MG PO TABS
ORAL_TABLET | ORAL | Status: AC
  Administered 2023-12-08: 1000 mg via ORAL
  Filled 2023-12-08: qty 2

## 2023-12-08 MED ORDER — ACETAMINOPHEN 650 MG RE SUPP: 650.0000 mg | RECTAL | Status: DC | PRN

## 2023-12-08 MED ORDER — THROMBIN 5000 UNITS EX KIT
PACK | CUTANEOUS | Status: AC
  Filled 2023-12-08: qty 1

## 2023-12-08 MED ORDER — CEFAZOLIN SODIUM-DEXTROSE 2-4 GM/100ML-% IV SOLN
2.0000 g | Freq: Three times a day (TID) | INTRAVENOUS | Status: AC
  Administered 2023-12-08 – 2023-12-09 (×2): 2 g via INTRAVENOUS
  Filled 2023-12-08 (×2): qty 100

## 2023-12-08 MED ORDER — BACITRACIN ZINC 500 UNIT/GM EX OINT
TOPICAL_OINTMENT | CUTANEOUS | Status: AC
  Filled 2023-12-08: qty 28.35

## 2023-12-08 MED ORDER — ORAL CARE MOUTH RINSE: 15.0000 mL | Freq: Once | OROMUCOSAL | Status: AC

## 2023-12-08 MED ORDER — HYDROMORPHONE HCL 1 MG/ML IJ SOLN
INTRAMUSCULAR | Status: AC
  Filled 2023-12-08: qty 0.5

## 2023-12-08 MED ORDER — SODIUM CHLORIDE 0.9 % IV SOLN
250.0000 mL | INTRAVENOUS | Status: DC
  Administered 2023-12-08: 250 mL via INTRAVENOUS

## 2023-12-08 MED ORDER — OXYCODONE HCL 5 MG PO TABS: 5.0000 mg | ORAL_TABLET | ORAL | Status: DC | PRN

## 2023-12-08 MED ORDER — EPHEDRINE 5 MG/ML INJ
INTRAVENOUS | Status: AC
  Filled 2023-12-08: qty 5

## 2023-12-08 MED ORDER — OXYCODONE HCL 5 MG/5ML PO SOLN: 5.0000 mg | Freq: Once | ORAL | Status: AC | PRN

## 2023-12-08 MED ORDER — ONDANSETRON HCL 4 MG/2ML IJ SOLN: 4.0000 mg | Freq: Four times a day (QID) | INTRAMUSCULAR | Status: DC | PRN

## 2023-12-08 MED ORDER — DOCUSATE SODIUM 100 MG PO CAPS
100.0000 mg | ORAL_CAPSULE | Freq: Two times a day (BID) | ORAL | Status: DC
  Administered 2023-12-08 – 2023-12-09 (×2): 100 mg via ORAL
  Filled 2023-12-08 (×2): qty 1

## 2023-12-08 MED ORDER — PHENYLEPHRINE HCL-NACL 20-0.9 MG/250ML-% IV SOLN
INTRAVENOUS | Status: DC | PRN
  Administered 2023-12-08: 160 ug via INTRAVENOUS

## 2023-12-08 MED ORDER — LACTATED RINGERS IV SOLN: INTRAVENOUS | Status: DC

## 2023-12-08 MED ORDER — BISACODYL 10 MG RE SUPP: 10.0000 mg | Freq: Every day | RECTAL | Status: DC | PRN

## 2023-12-08 MED ORDER — ONDANSETRON HCL 4 MG/2ML IJ SOLN
INTRAMUSCULAR | Status: AC
  Filled 2023-12-08: qty 2

## 2023-12-08 MED ORDER — MENTHOL 3 MG MT LOZG: 1.0000 | LOZENGE | OROMUCOSAL | Status: DC | PRN

## 2023-12-08 MED ORDER — BUPIVACAINE LIPOSOME 1.3 % IJ SUSP
INTRAMUSCULAR | Status: AC
  Filled 2023-12-08: qty 20

## 2023-12-08 MED ORDER — ACETAMINOPHEN 325 MG PO TABS: 650.0000 mg | ORAL_TABLET | ORAL | Status: DC | PRN

## 2023-12-08 MED ORDER — CHLORHEXIDINE GLUCONATE 0.12 % MT SOLN
15.0000 mL | Freq: Once | OROMUCOSAL | Status: AC
  Administered 2023-12-08: 15 mL via OROMUCOSAL
  Filled 2023-12-08: qty 15

## 2023-12-08 MED ORDER — ONDANSETRON HCL 4 MG/2ML IJ SOLN
INTRAMUSCULAR | Status: DC | PRN
  Administered 2023-12-08: 4 mg via INTRAVENOUS

## 2023-12-08 MED ORDER — VASHE WOUND IRRIGATION OPTIME
TOPICAL | Status: DC | PRN
  Administered 2023-12-08: 34 [oz_av]

## 2023-12-08 MED ORDER — CEFAZOLIN SODIUM-DEXTROSE 2-4 GM/100ML-% IV SOLN
2.0000 g | INTRAVENOUS | Status: AC
  Administered 2023-12-08: 2 g via INTRAVENOUS
  Filled 2023-12-08: qty 100

## 2023-12-08 MED ORDER — ACETAMINOPHEN 500 MG PO TABS: 1000.0000 mg | ORAL_TABLET | Freq: Once | ORAL | Status: AC

## 2023-12-08 MED ORDER — DEXAMETHASONE SODIUM PHOSPHATE 10 MG/ML IJ SOLN
INTRAMUSCULAR | Status: DC | PRN
  Administered 2023-12-08: 10 mg via INTRAVENOUS

## 2023-12-08 MED ORDER — ROCURONIUM BROMIDE 10 MG/ML (PF) SYRINGE
PREFILLED_SYRINGE | INTRAVENOUS | Status: AC
  Filled 2023-12-08: qty 10

## 2023-12-08 MED ORDER — FENTANYL CITRATE (PF) 250 MCG/5ML IJ SOLN
INTRAMUSCULAR | Status: DC | PRN
  Administered 2023-12-08: 50 ug via INTRAVENOUS
  Administered 2023-12-08 (×2): 25 ug via INTRAVENOUS

## 2023-12-08 MED ORDER — 0.9 % SODIUM CHLORIDE (POUR BTL) OPTIME
TOPICAL | Status: DC | PRN
  Administered 2023-12-08: 1000 mL

## 2023-12-08 MED ORDER — FLUTICASONE PROPIONATE 50 MCG/ACT NA SUSP: 2.0000 | Freq: Every day | NASAL | Status: DC | PRN

## 2023-12-08 MED ORDER — PHENOL 1.4 % MT LIQD: 1.0000 | OROMUCOSAL | Status: DC | PRN

## 2023-12-08 MED ORDER — HYDROMORPHONE HCL 1 MG/ML IJ SOLN
INTRAMUSCULAR | Status: DC | PRN
  Administered 2023-12-08: .5 mg via INTRAVENOUS

## 2023-12-08 MED ORDER — SERTRALINE HCL 50 MG PO TABS
50.0000 mg | ORAL_TABLET | Freq: Every day | ORAL | Status: DC
  Administered 2023-12-09: 50 mg via ORAL
  Filled 2023-12-08: qty 1

## 2023-12-08 MED ORDER — MORPHINE SULFATE (PF) 2 MG/ML IV SOLN: 2.0000 mg | INTRAVENOUS | Status: DC | PRN

## 2023-12-08 MED ORDER — DEXAMETHASONE SODIUM PHOSPHATE 10 MG/ML IJ SOLN
INTRAMUSCULAR | Status: AC
  Filled 2023-12-08: qty 1

## 2023-12-08 MED ORDER — ALBUMIN HUMAN 5 % IV SOLN: INTRAVENOUS | Status: DC | PRN

## 2023-12-08 MED ORDER — CYCLOBENZAPRINE HCL 10 MG PO TABS: 10.0000 mg | ORAL_TABLET | Freq: Three times a day (TID) | ORAL | Status: DC | PRN

## 2023-12-08 MED ORDER — BUPIVACAINE LIPOSOME 1.3 % IJ SUSP
INTRAMUSCULAR | Status: DC | PRN
  Administered 2023-12-08: 20 mL

## 2023-12-08 MED ORDER — FENTANYL CITRATE (PF) 250 MCG/5ML IJ SOLN
INTRAMUSCULAR | Status: AC
  Filled 2023-12-08: qty 5

## 2023-12-08 MED ORDER — BACITRACIN ZINC 500 UNIT/GM EX OINT
TOPICAL_OINTMENT | CUTANEOUS | Status: DC | PRN
  Administered 2023-12-08: 1 via TOPICAL

## 2023-12-08 SURGICAL SUPPLY — 62 items
BAG COUNTER SPONGE SURGICOUNT (BAG) ×1 IMPLANT
BASKET BONE COLLECTION (BASKET) ×1 IMPLANT
BENZOIN TINCTURE PRP APPL 2/3 (GAUZE/BANDAGES/DRESSINGS) ×1 IMPLANT
BLADE CLIPPER SURG (BLADE) IMPLANT
BUR MATCHSTICK NEURO 3.0 LAGG (BURR) ×1 IMPLANT
BUR PRECISION FLUTE 6.0 (BURR) ×1 IMPLANT
CANISTER SUCT 3000ML PPV (MISCELLANEOUS) ×1 IMPLANT
CLEANSER WND VASHE INSTL 34OZ (WOUND CARE) ×1 IMPLANT
CNTNR URN SCR LID CUP LEK RST (MISCELLANEOUS) ×1 IMPLANT
COVER BACK TABLE 60X90IN (DRAPES) ×1 IMPLANT
DRAPE C-ARM 42X72 X-RAY (DRAPES) ×2 IMPLANT
DRAPE HALF SHEET 40X57 (DRAPES) ×1 IMPLANT
DRAPE LAPAROTOMY 100X72X124 (DRAPES) ×1 IMPLANT
DRAPE SURG 17X23 STRL (DRAPES) ×1 IMPLANT
DRSG OPSITE POSTOP 4X10 (GAUZE/BANDAGES/DRESSINGS) IMPLANT
DRSG OPSITE POSTOP 4X6 (GAUZE/BANDAGES/DRESSINGS) ×1 IMPLANT
ELECT BLADE 4.0 EZ CLEAN MEGAD (MISCELLANEOUS) ×1 IMPLANT
ELECT REM PT RETURN 9FT ADLT (ELECTROSURGICAL) ×1 IMPLANT
ELECTRODE BLDE 4.0 EZ CLN MEGD (MISCELLANEOUS) ×1 IMPLANT
ELECTRODE REM PT RTRN 9FT ADLT (ELECTROSURGICAL) ×1 IMPLANT
EVACUATOR 1/8 PVC DRAIN (DRAIN) IMPLANT
GAUZE 4X4 16PLY ~~LOC~~+RFID DBL (SPONGE) ×1 IMPLANT
GLOVE BIO SURGEON STRL SZ 6 (GLOVE) ×1 IMPLANT
GLOVE BIO SURGEON STRL SZ8 (GLOVE) ×2 IMPLANT
GLOVE BIO SURGEON STRL SZ8.5 (GLOVE) ×2 IMPLANT
GLOVE BIOGEL PI IND STRL 6.5 (GLOVE) ×1 IMPLANT
GLOVE EXAM NITRILE XL STR (GLOVE) IMPLANT
GOWN STRL REUS W/ TWL LRG LVL3 (GOWN DISPOSABLE) ×1 IMPLANT
GOWN STRL REUS W/ TWL XL LVL3 (GOWN DISPOSABLE) ×2 IMPLANT
GOWN STRL REUS W/TWL 2XL LVL3 (GOWN DISPOSABLE) IMPLANT
HEMOSTAT POWDER KIT SURGIFOAM (HEMOSTASIS) ×1 IMPLANT
KIT BASIN OR (CUSTOM PROCEDURE TRAY) ×1 IMPLANT
KIT GRAFTMAG DEL NEURO DISP (NEUROSURGERY SUPPLIES) IMPLANT
KIT POSITION SURG JACKSON T1 (MISCELLANEOUS) ×1 IMPLANT
KIT TURNOVER KIT B (KITS) ×1 IMPLANT
NDL HYPO 21X1.5 SAFETY (NEEDLE) ×1 IMPLANT
NDL HYPO 22X1.5 SAFETY MO (MISCELLANEOUS) ×1 IMPLANT
NEEDLE HYPO 21X1.5 SAFETY (NEEDLE) ×1 IMPLANT
NEEDLE HYPO 22X1.5 SAFETY MO (MISCELLANEOUS) ×1 IMPLANT
NS IRRIG 1000ML POUR BTL (IV SOLUTION) ×1 IMPLANT
PACK LAMINECTOMY NEURO (CUSTOM PROCEDURE TRAY) ×1 IMPLANT
PAD ARMBOARD POSITIONER FOAM (MISCELLANEOUS) ×3 IMPLANT
PATTIES SURGICAL .5 X1 (DISPOSABLE) IMPLANT
PUTTY DBM 5CC CALC GRAN (Putty) IMPLANT
ROD PRE LORDOSED 5.5X45 (Rod) IMPLANT
SCREW CORT FIX FEN 5.5X7X40MM (Screw) IMPLANT
SCREW SET SINGLE INNER (Screw) IMPLANT
SCREW VIPER 7X50MM (Screw) IMPLANT
SPACER ALTERA 10X31 8-12MM-8 (Spacer) IMPLANT
SPIKE FLUID TRANSFER (MISCELLANEOUS) ×1 IMPLANT
SPONGE NEURO XRAY DETECT 1X3 (DISPOSABLE) IMPLANT
SPONGE SURGIFOAM ABS GEL 100 (HEMOSTASIS) IMPLANT
SPONGE T-LAP 4X18 ~~LOC~~+RFID (SPONGE) IMPLANT
STRIP CLOSURE SKIN 1/2X4 (GAUZE/BANDAGES/DRESSINGS) ×1 IMPLANT
SUT VIC AB 1 CT1 18XBRD ANBCTR (SUTURE) ×2 IMPLANT
SUT VIC AB 2-0 CP2 18 (SUTURE) ×2 IMPLANT
SYR 20ML LL LF (SYRINGE) IMPLANT
TAP EXPEDIUM DL 6.0 (INSTRUMENTS) IMPLANT
TOWEL GREEN STERILE (TOWEL DISPOSABLE) ×1 IMPLANT
TOWEL GREEN STERILE FF (TOWEL DISPOSABLE) ×1 IMPLANT
TRAY FOLEY MTR SLVR 16FR STAT (SET/KITS/TRAYS/PACK) ×1 IMPLANT
WATER STERILE IRR 1000ML POUR (IV SOLUTION) ×1 IMPLANT

## 2023-12-08 NOTE — Plan of Care (Signed)

## 2023-12-08 NOTE — Transfer of Care (Cosign Needed)
 Immediate Anesthesia Transfer of Care Note  Patient: Bradley Holland  Procedure(s) Performed: LUMBAR THREE-FOUR POSTERIOR LUMBAR INTERBODY FUSION WITH EXTENSION OF PREVIOUS FUSION  Patient Location: PACU  Anesthesia Type:General  Level of Consciousness: awake  Airway & Oxygen Therapy: Patient Spontanous Breathing and Patient connected to face mask oxygen  Post-op Assessment: Report given to RN and Post -op Vital signs reviewed and stable  Post vital signs: Reviewed and stable  Last Vitals:  Vitals Value Taken Time  BP 105/50 12/08/23 1641  Temp    Pulse 82 12/08/23 1645  Resp 12 12/08/23 1645  SpO2 90 % 12/08/23 1645  Vitals shown include unfiled device data.  Last Pain:  Vitals:   12/08/23 1042  TempSrc:   PainSc: 4       Patients Stated Pain Goal: 4 (12/08/23 1042)  Complications: No notable events documented.

## 2023-12-08 NOTE — Anesthesia Preprocedure Evaluation (Signed)
 Anesthesia Evaluation  Patient identified by MRN, date of birth, ID band Patient awake    Reviewed: Allergy & Precautions, NPO status , Patient's Chart, lab work & pertinent test results  Airway Mallampati: I  TM Distance: >3 FB Neck ROM: Full    Dental  (+) Dental Advisory Given, Chipped,    Pulmonary asthma , sleep apnea (does not use CPAP)    Pulmonary exam normal breath sounds clear to auscultation       Cardiovascular hypertension, negative cardio ROS Normal cardiovascular exam Rhythm:Regular Rate:Normal     Neuro/Psych  PSYCHIATRIC DISORDERS  Depression    negative neurological ROS     GI/Hepatic Neg liver ROS,GERD  ,,  Endo/Other  Hypothyroidism    Renal/GU negative Renal ROS  negative genitourinary   Musculoskeletal negative musculoskeletal ROS (+)    Abdominal   Peds  Hematology negative hematology ROS (+)   Anesthesia Other Findings   Reproductive/Obstetrics                             Anesthesia Physical Anesthesia Plan  ASA: 3  Anesthesia Plan: General   Post-op Pain Management: Tylenol PO (pre-op)*   Induction:   PONV Risk Score and Plan: 2 and Treatment may vary due to age or medical condition, Dexamethasone and Ondansetron  Airway Management Planned: Oral ETT  Additional Equipment:   Intra-op Plan:   Post-operative Plan: Extubation in OR  Informed Consent: I have reviewed the patients History and Physical, chart, labs and discussed the procedure including the risks, benefits and alternatives for the proposed anesthesia with the patient or authorized representative who has indicated his/her understanding and acceptance.       Plan Discussed with: Anesthesiologist and CRNA  Anesthesia Plan Comments: (Patient identified. Risks, benefits, options discussed with patient including but not limited to bleeding, infection, nerve damage, paralysis, failed block,  incomplete pain control, headache, blood pressure changes, nausea, vomiting, reactions to medication, itching, and post partum back pain. Confirmed with bedside nurse the patient's most recent platelet count. Confirmed with the patient that they are not taking any anticoagulation, have any bleeding history or any family history of bleeding disorders. Patient expressed understanding and wishes to proceed. All questions were answered. )       Anesthesia Quick Evaluation

## 2023-12-08 NOTE — Op Note (Signed)
 Brief history: The patient is a 78 year old white male whose had a previous lumbar fusion at L4-5 many years ago by another physician.  He has Velp back and bilateral leg pain consistent with neurogenic claudication.  He was worked up with a lumbar mild CT which demonstrated severe spinal stenosis at L3-4.  I discussed the various treatment options with him.  He has decided proceed with surgery.  Preoperative diagnosis: Lumbar adjacent segment disease with spondylolisthesis, lumbar degenerative disc disease, spinal stenosis compressing both the L3 and the L4 nerve roots; lumbago; lumbar radiculopathy; neurogenic claudication  Postoperative diagnosis: The same  Procedure: Bilateral L3-4 laminotomy/foraminotomies/medial facetectomy to decompress the bilateral L3 and L4 nerve roots(the work required to do this was in addition to the work required to do the posterior lumbar interbody fusion because of the patient's spinal stenosis, facet arthropathy. Etc. requiring a wide decompression of the nerve roots.); left L3-4 transforaminal lumbar interbody fusion with local morselized autograft bone and Zimmer DBM; insertion of interbody prosthesis at L3-4 (globus peek expandable interbody prosthesis); posterior nonsegmental instrumentation from L3 to L4 for with globus titanium pedicle screws and rods; posterior lateral arthrodesis at L3-4 with local morselized autograft bone and Zimmer DBM; exploration of lumbar fusion/removal of lumbar hardware  Surgeon: Dr. Delma Officer  Asst.: Dr. Hoyt Koch  Anesthesia: Gen. endotracheal  Estimated blood loss: 400 cc  Drains: Medium Hemovac drain in the epidural space Complications: None  Description of procedure: The patient was brought to the operating room by the anesthesia team. General endotracheal anesthesia was induced. The patient was turned to the prone position on the Wilson frame. The patient's lumbosacral region was then prepared with Betadine scrub and  Betadine solution. Sterile drapes were applied.  I then injected the area to be incised with Marcaine with epinephrine solution. I then used the scalpel to make a linear midline incision over the L3-4 and L4-5 interspace, incising through the old surgical scar. I then used electrocautery to perform a bilateral subperiosteal dissection exposing the spinous process and lamina of L3-4 and L5 and exposing the old hardware at L4-5. We then obtained intraoperative radiograph to confirm our location. We then inserted the Verstrac retractor to provide exposure.  We explored the fusion by removing the old hardware at L4-5.  The arthrodesis at L4-5 look good.  I began the decompression by using the high speed drill to perform laminotomies at L3-4 bilaterally. We then used the Kerrison punches to widen the laminotomy and removed the ligamentum flavum at L3-4 bilaterally. We used the Kerrison punches to remove the medial facets at L3-4 bilaterally. We performed wide foraminotomies about the bilateral L3 and L4 nerve roots completing the decompression.  We now turned our attention to the posterior lumbar interbody fusion. I used a scalpel to incise the intervertebral disc at L3-4 bilaterally. I then performed a partial intervertebral discectomy at L3-4 bilaterally using the pituitary forceps. We prepared the vertebral endplates at L3-4 bilaterally for the fusion by removing the soft tissues with the curettes. We then used the trial spacers to pick the appropriate sized interbody prosthesis. We prefilled his prosthesis with a combination of local morselized autograft bone that we obtained during the decompression as well as Zimmer DBM. We inserted the prefilled prosthesis into the interspace at L3-4 from the left, we then turned and expanded the prosthesis. There was a good snug fit of the prosthesis in the interspace. We then filled and the remainder of the intervertebral disc space with local morselized autograft  bone  and Zimmer DBM. This completed the posterior lumbar interbody arthrodesis.  During the decompression and insertion of the prosthesis the assistant protected the thecal sac and nerve roots with the D'Errico retractor.  We now turned attention to the instrumentation. Under fluoroscopic guidance we cannulated the bilateral L3 pedicles with the bone probe. We then removed the bone probe. We then tapped the pedicle with a 6.0 millimeter tap. We then removed the tap. We probed inside the tapped pedicle with a ball probe to rule out cortical breaches. We then inserted a 7.0 x 55 millimeter pedicle screw into the L3 pedicles bilaterally under fluoroscopic guidance.  We replaced the old screws at L4 with 7.0 x 40 mm screws we then palpated along the medial aspect of the pedicles to rule out cortical breaches. There were none. The nerve roots were not injured. We then connected the unilateral pedicle screws with a lordotic rod. We compressed the construct and secured the rod in place with the caps. We then tightened the caps appropriately. This completed the instrumentation from L3-4.  We now turned our attention to the posterior lateral arthrodesis at L3-4. We used the high-speed drill to decorticate the remainder of the facets, pars, transverse process at L3-4. We then applied a combination of local morselized autograft bone and Zimmer DBM over these decorticated posterior lateral structures. This completed the posterior lateral arthrodesis.  We then obtained hemostasis using bipolar electrocautery. We irrigated the wound out with vashe solution. We inspected the thecal sac and nerve roots and noted they were well decompressed. We then removed the retractor.  We placed a medium Hemovac drain in the epidural space and tunneled it out through a separate stab wound.  We injected Exparel . We reapproximated patient's thoracolumbar fascia with interrupted #1 Vicryl suture. We reapproximated patient's subcutaneous tissue  with interrupted 2-0 Vicryl suture. The reapproximated patient's skin with Steri-Strips and benzoin. The wound was then coated with bacitracin ointment. A sterile dressing was applied. The drapes were removed. The patient was subsequently returned to the supine position where they were extubated by the anesthesia team. He was then transported to the post anesthesia care unit in stable condition. All sponge instrument and needle counts were reportedly correct at the end of this case.

## 2023-12-08 NOTE — Anesthesia Procedure Notes (Cosign Needed)
 Procedure Name: Intubation Date/Time: 12/08/2023 12:33 PM  Performed by: Woodrum, Chelsey L, MDPre-anesthesia Checklist: Patient identified, Emergency Drugs available, Suction available and Patient being monitored Patient Re-evaluated:Patient Re-evaluated prior to induction Oxygen Delivery Method: Circle system utilized Preoxygenation: Pre-oxygenation with 100% oxygen Induction Type: IV induction Ventilation: Mask ventilation without difficulty and Two handed mask ventilation required Laryngoscope Size: Glidescope and 4 Grade View: Grade I Tube type: Oral Tube size: 7.5 mm Number of attempts: 1 Airway Equipment and Method: Stylet, Oral airway and Video-laryngoscopy Placement Confirmation: ETT inserted through vocal cords under direct vision, positive ETCO2 and breath sounds checked- equal and bilateral Secured at: 22 cm Tube secured with: Tape Dental Injury: Teeth and Oropharynx as per pre-operative assessment

## 2023-12-08 NOTE — Progress Notes (Signed)
 Orthopedic Tech Progress Note Patient Details:  Bradley Holland 02-15-1946 [id number]  Ortho Devices Type of Ortho Device: Lumbar corsett Ortho Device/Splint Location: back Ortho Device/Splint Interventions: Ordered   Post Interventions Patient Tolerated: Well Instructions Provided: Care of device  Donald Pore 12/08/2023, 6:09 PM

## 2023-12-08 NOTE — H&P (Signed)
 Subjective: The patient is a 78 year old white male who has had a L4-5 decompression and fusion by the other physician in 2006.  He has complained of back and right greater left buttock and leg pain consistent with neurogenic claudication.  He failed medical management was worked up with a lumbar mild CT which demonstrated severe multifactorial spinal stenosis at L3-4.  I discussed the various treatment options with him.  Has decided proceed with surgery.  Past Medical History:  Diagnosis Date   Aortic atherosclerosis (HCC)    Arthritis    Asthma    DDD (degenerative disc disease), thoracic 12/12/2019   Depression    Fatty liver    GERD (gastroesophageal reflux disease)    Hearing loss    Hx of degenerative disc disease    Hypertension    Hypothyroidism    Prostate enlargement    Radiculitis, thoracic 12/18/2019   Shingles 08/2011   Sleep apnea    does not wear CPAP    Past Surgical History:  Procedure Laterality Date   BACK SURGERY     COLONOSCOPY  09/14/2011   Dr. Marva Panda   COLONOSCOPY WITH PROPOFOL N/A 07/09/2015   Procedure: COLONOSCOPY WITH PROPOFOL;  Surgeon: Earline Mayotte, MD;  Location: [facility] ENDOSCOPY;  Service: Endoscopy;  Laterality: N/A;   COLONOSCOPY WITH PROPOFOL N/A 09/05/2020   Procedure: COLONOSCOPY WITH PROPOFOL;  Surgeon: Earline Mayotte, MD;  Location: [facility] ENDOSCOPY;  Service: Endoscopy;  Laterality: N/A;   NECK SURGERY     PALATE SURGERY      Allergies  Allergen Reactions   Ambien [Zolpidem Tartrate] Other (See Comments)    "crazy"   Simvastatin Other (See Comments)    Pt unsure    Sulfa Antibiotics Rash    Social History   Tobacco Use   Smoking status: Never   Smokeless tobacco: Never  Substance Use Topics   Alcohol use: No    History reviewed. No pertinent family history. Prior to Admission medications   Medication Sig Start Date End Date Taking? Authorizing Provider  ALPRAZolam Prudy Feeler) 0.25 MG tablet Take 0.25 mg by mouth at bedtime  as needed for anxiety or sleep. 02/10/15  Yes [provider]  amLODipine (NORVASC) 2.5 MG tablet Take 2.5 mg by mouth daily.   Yes [provider]  aspirin 81 MG tablet Take 81 mg by mouth daily.   Yes [provider]  B Complex Vitamins (B-COMPLEX/B-12 PO) Take 1 tablet by mouth daily.   Yes [provider]  levothyroxine (SYNTHROID) 100 MCG tablet Take 100 mcg by mouth daily before breakfast. 02/21/19  Yes [provider]  nystatin cream (MYCOSTATIN) Apply 1 Application topically 2 (two) times daily as needed for dry skin.   Yes [provider]  pantoprazole (PROTONIX) 40 MG tablet Take 40 mg by mouth daily. 01/18/23 01/18/24 Yes [provider]  potassium chloride (KLOR-CON M) 10 MEQ tablet Take 10 mEq by mouth daily. 01/19/23 01/19/24 Yes [provider]  sertraline (ZOLOFT) 100 MG tablet Take 50 mg by mouth daily. 12/09/14  Yes [provider]  traMADol (ULTRAM) 50 MG tablet Take 50 mg by mouth daily as needed for moderate pain (pain score 4-6) or severe pain (pain score 7-10). 12/25/14  Yes [provider]  fluticasone (FLONASE) 50 MCG/ACT nasal spray Place 2 sprays into both nostrils daily as needed for allergies or rhinitis.    [provider]     Review of Systems  Positive ROS: As above  All  other systems have been reviewed and were otherwise negative with the exception of those mentioned in the HPI and as above.  Objective: Vital signs in last 24 hours: Temp:  [97.8 F (36.6 C)] 97.8 F (36.6 C) (04/10 1034) Pulse Rate:  [61] 61 (04/10 1034) Resp:  [18] 18 (04/10 1034) BP: (145)/(67) 145/67 (04/10 1034) SpO2:  [97 %] 97 % (04/10 1034) Weight:  [78 kg] 78 kg (04/10 1034) Estimated body mass index is 26.94 kg/m as calculated from the following:   Height as of this encounter: 5\' 7"  (1.702 m).   Weight as of this encounter: 78 kg.   General Appearance: Alert Head: Normocephalic,  without obvious abnormality, atraumatic Eyes: PERRL, conjunctiva/corneas clear, EOM's intact,    Ears: Normal  Throat: Normal  Neck: Cervical incision is well-healed.  He has limited cervical range of motion. Back: Lumbar incision is well-healed. Lungs: Clear to auscultation bilaterally, respirations unlabored Heart: Regular rate and rhythm, no murmur, rub or gallop Abdomen: Soft, non-tender Extremities: Extremities normal, atraumatic, no cyanosis or edema Skin: unremarkable  NEUROLOGIC:   Mental status: alert and oriented,Motor Exam - grossly normal Sensory Exam - grossly normal Reflexes:  Coordination - grossly normal Gait - grossly normal Balance - grossly normal Cranial Nerves: I: smell Not tested  II: visual acuity  OS: Normal  OD: Normal   II: visual fields Full to confrontation  II: pupils Equal, round, reactive to light  III,VII: ptosis None  III,IV,VI: extraocular muscles  Full ROM  V: mastication Normal  V: facial light touch sensation  Normal  V,VII: corneal reflex  Present  VII: facial muscle function - upper  Normal  VII: facial muscle function - lower Normal  VIII: hearing Not tested  IX: soft palate elevation  Normal  IX,X: gag reflex Present  XI: trapezius strength  5/5  XI: sternocleidomastoid strength 5/5  XI: neck flexion strength  5/5  XII: tongue strength  Normal    Data Review Lab Results  Component Value Date   WBC 5.3 11/25/2023   HGB 15.0 11/25/2023   HCT 45.2 11/25/2023   MCV 91.9 11/25/2023   PLT 192 11/25/2023   Lab Results  Component Value Date   NA 139 11/25/2023   K 5.0 11/25/2023   CL 103 11/25/2023   CO2 29 11/25/2023   BUN 17 11/25/2023   CREATININE 1.07 11/25/2023   GLUCOSE 107 (H) 11/25/2023   Lab Results  Component Value Date   INR 0.9 05/25/2007    Assessment/Plan: Lumbar adjacent segment disease with spondylolisthesis, lumbar spinal stenosis, neurogenic claudication: I discussed the situation with the patient.  I  reviewed his imaging studies with him and pointed out the abnormalities.  We have discussed the various treatment options including surgery.  I described the surgical treatment option of an solution of his lumbar fusion with L3-4 decompression, instrumentation and fusion.  I have shown him surgical models.  I have given him a surgical pamphlet.  We have discussed the risk, benefits, alternatives, expected postop course, and the likelihood of achieving our goals with surgery.  I have answered all the patient's questions.  He has decided proceed with surgery.   Cristi Loron 12/08/2023 11:16 AM

## 2023-12-09 DIAGNOSIS — M4316 Spondylolisthesis, lumbar region: Secondary | ICD-10-CM | POA: Diagnosis not present

## 2023-12-09 MED ORDER — DOCUSATE SODIUM 100 MG PO CAPS: 100.0000 mg | ORAL_CAPSULE | Freq: Two times a day (BID) | ORAL | 0 refills | Status: AC

## 2023-12-09 MED ORDER — CYCLOBENZAPRINE HCL 5 MG PO TABS: 5.0000 mg | ORAL_TABLET | Freq: Three times a day (TID) | ORAL | 0 refills | Status: AC | PRN

## 2023-12-09 MED ORDER — OXYCODONE-ACETAMINOPHEN 5-325 MG PO TABS: 1.0000 | ORAL_TABLET | ORAL | Status: DC | PRN

## 2023-12-09 MED ORDER — CYCLOBENZAPRINE HCL 5 MG PO TABS: 5.0000 mg | ORAL_TABLET | Freq: Three times a day (TID) | ORAL | Status: DC | PRN

## 2023-12-09 MED ORDER — OXYCODONE-ACETAMINOPHEN 5-325 MG PO TABS
1.0000 | ORAL_TABLET | ORAL | 0 refills | Status: AC | PRN
Start: 2023-12-09 — End: ?

## 2023-12-09 MED FILL — Heparin Sodium (Porcine) Inj 1000 Unit/ML: INTRAMUSCULAR | Qty: 30 | Status: AC

## 2023-12-09 MED FILL — Thrombin For Soln 5000 Unit: CUTANEOUS | Qty: 5000 | Status: AC

## 2023-12-09 MED FILL — Sodium Chloride IV Soln 0.9%: INTRAVENOUS | Qty: 2000 | Status: AC

## 2023-12-09 NOTE — Evaluation (Signed)
 Physical Therapy Evaluation  Patient Details Name: Bradley Holland MRN: [id number] DOB: 1946-02-23 Today's Date: 12/09/2023  History of Present Illness  Pt is a 78 y/o male who presents s/p L3-L4 PLIF on 12/08/2023. PMH significant for HTN, hypothyroidism, prostate enlargement, shingles, prior back surgery, cervical surgery, palate surgery.  Clinical Impression  Pt admitted with above diagnosis. At the time of PT eval, pt was able to demonstrate transfers and ambulation with up to supervision for safety and RW for support. Pt was educated on precautions, brace application/wearing schedule, appropriate activity progression, car transfer, and ADL completion within limits of back precautions. Pt currently with functional limitations due to the deficits listed below (see PT Problem List). Pt will benefit from skilled PT to increase their independence and safety with mobility to allow discharge to the venue listed below.          If plan is discharge home, recommend the following: A little help with walking and/or transfers;A little help with bathing/dressing/bathroom;Assistance with cooking/housework;Help with stairs or ramp for entrance;Assist for transportation   Can travel by private vehicle        Equipment Recommendations Rolling walker (2 wheels);BSC/3in1  Recommendations for Other Services       Functional Status Assessment Patient has had a recent decline in their functional status and demonstrates the ability to make significant improvements in function in a reasonable and predictable amount of time.     Precautions / Restrictions Precautions Precautions: Fall;Back Precaution Booklet Issued: Yes (comment) Recall of Precautions/Restrictions: Intact Precaution/Restrictions Comments: Reviewed handout and pt was cued for precautions during functional mobility. Required Braces or Orthoses: Spinal Brace Spinal Brace: Lumbar corset;Applied in sitting position Restrictions Weight  Bearing Restrictions Per Provider Order: No      Mobility  Bed Mobility Overal bed mobility: Modified Independent             General bed mobility comments: HOB flat and rails lowered to simulate home environment.    Transfers Overall transfer level: Needs assistance Equipment used: Rolling walker (2 wheels) Transfers: Sit to/from Stand Sit to Stand: Supervision           General transfer comment: Light supervision for safety as pt powered up to full stand. No assist required.    Ambulation/Gait Ambulation/Gait assistance: Supervision Gait Distance (Feet): 300 Feet Assistive device: Rolling walker (2 wheels) Gait Pattern/deviations: Step-through pattern, Decreased stride length, Trunk flexed Gait velocity: Decreased Gait velocity interpretation: <1.31 ft/sec, indicative of household ambulator   General Gait Details: VC's for improved posture, closer walker proximity, and forward gaze. No assist required but pt slightly unsteady without RW  Stairs Stairs: Yes Stairs assistance: Contact guard assist Stair Management: One rail Right, Step to pattern, Forwards Number of Stairs: 10 General stair comments: VC's for sequencing and general safety.  Wheelchair Mobility     Tilt Bed    Modified Rankin (Stroke Patients Only)       Balance Overall balance assessment: Needs assistance Sitting-balance support: Feet supported, No upper extremity supported Sitting balance-Leahy Scale: Fair     Standing balance support: No upper extremity supported, During functional activity Standing balance-Leahy Scale: Fair Standing balance comment: statically                             Pertinent Vitals/Pain Pain Assessment Pain Assessment: Faces Faces Pain Scale: Hurts little more Pain Location: Back/incision site Pain Descriptors / Indicators: Operative site guarding, Sore Pain Intervention(s): Limited activity within  patient's tolerance, Monitored during  session, Repositioned    Home Living Family/patient expects to be discharged to:: Private residence Living Arrangements: Spouse/significant other Available Help at Discharge: Family;Available 24 hours/day Type of Home: House Home Access: Stairs to enter Entrance Stairs-Rails: Right;Left;Can reach both Entrance Stairs-Number of Steps: 3   Home Layout: One level Home Equipment: Cane - single point      Prior Function Prior Level of Function : Independent/Modified Independent                     Extremity/Trunk Assessment   Upper Extremity Assessment Upper Extremity Assessment: Defer to OT evaluation    Lower Extremity Assessment Lower Extremity Assessment: Generalized weakness    Cervical / Trunk Assessment Cervical / Trunk Assessment: Back Surgery  Communication   Communication Communication: No apparent difficulties    Cognition Arousal: Alert Behavior During Therapy: WFL for tasks assessed/performed   PT - Cognitive impairments: No apparent impairments                         Following commands: Intact       Cueing Cueing Techniques: Verbal cues, Gestural cues     General Comments      Exercises     Assessment/Plan    PT Assessment Patient needs continued PT services  PT Problem List Decreased strength;Decreased activity tolerance;Decreased balance;Decreased mobility;Decreased knowledge of use of DME;Decreased safety awareness;Decreased knowledge of precautions;Pain       PT Treatment Interventions DME instruction;Gait training;Stair training;Functional mobility training;Therapeutic activities;Therapeutic exercise;Balance training;Patient/family education    PT Goals (Current goals can be found in the Care Plan section)  Acute Rehab PT Goals Patient Stated Goal: Home today PT Goal Formulation: With patient Time For Goal Achievement: 12/16/23 Potential to Achieve Goals: Good    Frequency Min 5X/week     Co-evaluation                AM-PAC PT "6 Clicks" Mobility  Outcome Measure Help needed turning from your back to your side while in a flat bed without using bedrails?: None Help needed moving from lying on your back to sitting on the side of a flat bed without using bedrails?: None Help needed moving to and from a bed to a chair (including a wheelchair)?: A Little Help needed standing up from a chair using your arms (e.g., wheelchair or bedside chair)?: A Little Help needed to walk in hospital room?: A Little Help needed climbing 3-5 steps with a railing? : A Little 6 Click Score: 20    End of Session Equipment Utilized During Treatment: Gait belt;Back brace Activity Tolerance: Patient tolerated treatment well Patient left: in bed;with call bell/phone within reach Nurse Communication: Mobility status PT Visit Diagnosis: Unsteadiness on feet (R26.81);Pain Pain - part of body:  (back)    Time: 9147-8295 PT Time Calculation (min) (ACUTE ONLY): 26 min   Charges:   PT Evaluation $PT Eval Low Complexity: 1 Low PT Treatments $Gait Training: 8-22 mins PT General Charges $$ ACUTE PT VISIT: 1 Visit         Conni Slipper, PT, DPT Acute Rehabilitation Services Secure Chat Preferred Office: [phone]   Marylynn Pearson 12/09/2023, 10:57 AM

## 2023-12-09 NOTE — Discharge Summary (Signed)
 Physician Discharge Summary  Patient ID: Bradley Holland MRN: [id number] DOB/AGE: [birth year] 78 y.o.  Admit date: 12/08/2023 Discharge date: 12/09/2023  Admission Diagnoses: Lumbar adjacent segment disease with spondylolisthesis, lumbar spinal stenosis, neurogenic claudication, lumbar radiculopathy  Discharge Diagnoses: The same Principal Problem:   Lumbar adjacent segment disease with spondylolisthesis   Discharged Condition: good  Hospital Course: I performed an L3-4 decompression, instrumentation and fusion on patient on 12/08/2023.  The surgery went well.  The patient's postoperative course was unremarkable.  On postoperative day #1 he felt well and requested discharge to home.  He was given verbal and written discharge instructions.  All his questions were answered.  Consults: PT, care management Significant Diagnostic Studies: None Treatments: Exploration of lumbar fusion/removal of old lumbar hardware; L3-4 decompression, instrumentation and fusion. Discharge Exam: Blood pressure (!) 108/59, pulse 65, temperature 97.7 F (36.5 C), temperature source Oral, resp. rate 18, height 5\' 7"  (1.702 m), weight 78 kg, SpO2 91%. The patient is alert and pleasant.  He looks well.  His strength is normal.  Disposition: Home  Discharge Instructions     Call MD for:  difficulty breathing, headache or visual disturbances   Complete by: As directed    Call MD for:  extreme fatigue   Complete by: As directed    Call MD for:  hives   Complete by: As directed    Call MD for:  persistant dizziness or light-headedness   Complete by: As directed    Call MD for:  persistant nausea and vomiting   Complete by: As directed    Call MD for:  redness, tenderness, or signs of infection (pain, swelling, redness, odor or green/yellow discharge around incision site)   Complete by: As directed    Call MD for:  severe uncontrolled pain   Complete by: As directed    Call MD for:  temperature >100.4    Complete by: As directed    Diet - low sodium heart healthy   Complete by: As directed    Discharge instructions   Complete by: As directed    Call [phone] for a followup appointment. Take a stool softener while you are using pain medications.   Driving Restrictions   Complete by: As directed    Do not drive for 2 weeks.   Increase activity slowly   Complete by: As directed    Lifting restrictions   Complete by: As directed    Do not lift more than 5 pounds. No excessive bending or twisting.   May shower / Bathe   Complete by: As directed    Remove the dressing for 3 days after surgery.  You may shower, but leave the incision alone.   Remove dressing in 48 hours   Complete by: As directed       Allergies as of 12/09/2023       Reactions   Ambien [zolpidem Tartrate] Other (See Comments)   "crazy"   Simvastatin Other (See Comments)   Pt unsure    Sulfa Antibiotics Rash        Medication List     STOP taking these medications    traMADol 50 MG tablet Commonly known as: ULTRAM       TAKE these medications    ALPRAZolam 0.25 MG tablet Commonly known as: XANAX Take 0.25 mg by mouth at bedtime as needed for anxiety or sleep.   amLODipine 2.5 MG tablet Commonly known as: NORVASC Take 2.5 mg by mouth daily.   aspirin  81 MG tablet Take 81 mg by mouth daily.   B-COMPLEX/B-12 PO Take 1 tablet by mouth daily.   cyclobenzaprine 5 MG tablet Commonly known as: FLEXERIL Take 1 tablet (5 mg total) by mouth 3 (three) times daily as needed for muscle spasms.   docusate sodium 100 MG capsule Commonly known as: COLACE Take 1 capsule (100 mg total) by mouth 2 (two) times daily.   fluticasone 50 MCG/ACT nasal spray Commonly known as: FLONASE Place 2 sprays into both nostrils daily as needed for allergies or rhinitis.   levothyroxine 100 MCG tablet Commonly known as: SYNTHROID Take 100 mcg by mouth daily before breakfast.   nystatin cream Commonly known as:  MYCOSTATIN Apply 1 Application topically 2 (two) times daily as needed for dry skin.   oxyCODONE-acetaminophen 5-325 MG tablet Commonly known as: PERCOCET/ROXICET Take 1-2 tablets by mouth every 4 (four) hours as needed for moderate pain (pain score 4-6).   pantoprazole 40 MG tablet Commonly known as: PROTONIX Take 40 mg by mouth daily.   potassium chloride 10 MEQ tablet Commonly known as: KLOR-CON M Take 10 mEq by mouth daily.   sertraline 100 MG tablet Commonly known as: ZOLOFT Take 50 mg by mouth daily.         Signed: Cristi Loron 12/09/2023, 6:47 AM

## 2023-12-09 NOTE — Plan of Care (Signed)

## 2023-12-09 NOTE — Progress Notes (Signed)

## 2023-12-11 NOTE — Anesthesia Postprocedure Evaluation (Signed)
 Anesthesia Post Note  Patient: Bradley Holland  Procedure(s) Performed: LUMBAR THREE-FOUR POSTERIOR LUMBAR INTERBODY FUSION WITH EXTENSION OF PREVIOUS FUSION     Patient location during evaluation: PACU Anesthesia Type: General Level of consciousness: awake and alert Pain management: pain level controlled Vital Signs Assessment: post-procedure vital signs reviewed and stable Respiratory status: spontaneous breathing, nonlabored ventilation, respiratory function stable and patient connected to nasal cannula oxygen Cardiovascular status: blood pressure returned to baseline and stable Postop Assessment: no apparent nausea or vomiting Anesthetic complications: no  No notable events documented.  Last Vitals:  Vitals:   12/09/23 0418 12/09/23 0723  BP: (!) 108/59 (!) 108/58  Pulse: 65 72  Resp: 18 19  Temp: 36.5 C 37.2 C  SpO2: 91% 96%    Last Pain:  Vitals:   12/09/23 0723  TempSrc: Oral  PainSc:    Pain Goal: Patients Stated Pain Goal: 4 (12/08/23 1948)                 [name] L Heidee Audi

## 2023-12-30 DIAGNOSIS — M51369 Other intervertebral disc degeneration, lumbar region without mention of lumbar back pain or lower extremity pain: Secondary | ICD-10-CM | POA: Diagnosis not present

## 2023-12-30 DIAGNOSIS — M48062 Spinal stenosis, lumbar region with neurogenic claudication: Secondary | ICD-10-CM | POA: Diagnosis not present

## 2023-12-30 DIAGNOSIS — M4316 Spondylolisthesis, lumbar region: Secondary | ICD-10-CM | POA: Diagnosis not present

## 2024-01-10 DIAGNOSIS — E538 Deficiency of other specified B group vitamins: Secondary | ICD-10-CM | POA: Diagnosis not present

## 2024-02-10 DIAGNOSIS — E538 Deficiency of other specified B group vitamins: Secondary | ICD-10-CM | POA: Diagnosis not present

## 2024-02-20 DIAGNOSIS — M65332 Trigger finger, left middle finger: Secondary | ICD-10-CM | POA: Diagnosis not present

## 2024-02-20 DIAGNOSIS — M720 Palmar fascial fibromatosis [Dupuytren]: Secondary | ICD-10-CM | POA: Diagnosis not present

## 2024-02-20 DIAGNOSIS — M65331 Trigger finger, right middle finger: Secondary | ICD-10-CM | POA: Diagnosis not present

## 2024-03-13 DIAGNOSIS — E538 Deficiency of other specified B group vitamins: Secondary | ICD-10-CM | POA: Diagnosis not present

## 2024-03-15 ENCOUNTER — Ambulatory Visit: Admitting: Urology

## 2024-03-15 VITALS — BP 146/68 | HR 67 | Ht 67.0 in | Wt 175.0 lb

## 2024-03-15 DIAGNOSIS — Z125 Encounter for screening for malignant neoplasm of prostate: Secondary | ICD-10-CM | POA: Diagnosis not present

## 2024-03-15 DIAGNOSIS — N5089 Other specified disorders of the male genital organs: Secondary | ICD-10-CM | POA: Diagnosis not present

## 2024-03-15 DIAGNOSIS — N471 Phimosis: Secondary | ICD-10-CM | POA: Diagnosis not present

## 2024-03-15 MED ORDER — CLOTRIMAZOLE-BETAMETHASONE 1-0.05 % EX CREA: 1.0000 | TOPICAL_CREAM | Freq: Two times a day (BID) | CUTANEOUS | 0 refills | Status: DC

## 2024-03-15 NOTE — Patient Instructions (Signed)
 Prostate Cancer Screening  Prostate cancer screening is testing that is done to check for the presence of prostate cancer in men. The prostate gland is a walnut-sized gland that is located below the bladder and in front of the rectum in males. The function of the prostate is to add fluid to semen during ejaculation. Prostate cancer is one of the most common types of cancer in men. Who should have prostate cancer screening? Screening recommendations vary based on age and other risk factors, as well as between the professional organizations who make the recommendations. In general, screening is recommended if: You are age 79 to 100 and have an average risk for prostate cancer. You should talk with your health care provider about your need for screening and how often screening should be done. Because most prostate cancers are slow growing and will not cause death, screening in this age group is generally reserved for men who have a 10- to 15-year life expectancy. You are younger than age 79, and you have these risk factors: Having a father, brother, or uncle who has been diagnosed with prostate cancer. The risk is higher if your family member's cancer occurred at an early age or if you have multiple family members with prostate cancer at an early age. Being a male who is [place] or is of [place] or sub-Saharan African descent. In general, screening is not recommended if: You are younger than age 79. You are between the ages of 36 and 45 and you have no risk factors. You are 79 years of age or older. At this age, the risks that screening can cause are greater than the benefits that it may provide. If you are at high risk for prostate cancer, your health care provider may recommend that you have screenings more often or that you start screening at a younger age. How is screening for prostate cancer done? The recommended prostate cancer screening test is a blood test called the prostate-specific antigen  (PSA) test. PSA is a protein that is made in the prostate. As you age, your prostate naturally produces more PSA. Abnormally high PSA levels may be caused by: Prostate cancer. An enlarged prostate that is not caused by cancer (benign prostatic hyperplasia, or BPH). This condition is very common in older men. A prostate gland infection (prostatitis) or urinary tract infection. Certain medicines such as male hormones (like testosterone) or other medicines that raise testosterone levels. A rectal exam may be done as part of prostate cancer screening to help provide information about the size of your prostate gland. When a rectal exam is performed, it should be done after the PSA level is drawn to avoid any effect on the results. Depending on the PSA results, you may need more tests, such as:  Blood and imaging tests. A procedure to remove tissue samples from your prostate gland for testing (biopsy). This is the only way to know for certain if you have prostate cancer. What are the benefits of prostate cancer screening? Screening can help to identify cancer at an early stage, before symptoms start and when the cancer can be treated more easily. There is a small chance that screening may lower your risk of dying from prostate cancer. The chance is small because prostate cancer is a slow-growing cancer, and most men with prostate cancer die from a different cause. What are the risks of prostate cancer screening? The main risk of prostate cancer screening is diagnosing and treating prostate cancer that would never have caused  any symptoms or problems. This is called overdiagnosisand overtreatment. PSA screening cannot tell you if your PSA is high due to cancer or a different cause. A prostate biopsy is the only procedure to diagnose prostate cancer. Even the results of a biopsy may not tell you if your cancer needs to be treated. Slow-growing prostate cancer may not need any treatment other than monitoring,  so diagnosing and treating it may cause unnecessary stress or other side effects. Questions to ask your health care provider When should I start prostate cancer screening? What is my risk for prostate cancer? How often do I need screening? What type of screening tests do I need? How do I get my test results? What do my results mean? Do I need treatment? Where to find more information The American Cancer Society: www.cancer.org American Urological Association: www.auanet.org Contact a health care provider if: You have difficulty urinating. You have pain when you urinate or ejaculate. You have blood in your urine or semen. You have pain in your back or in the area of your prostate. Summary Prostate cancer is a common type of cancer in men. The prostate gland is located below the bladder and in front of the rectum. This gland adds fluid to semen during ejaculation. Prostate cancer screening may identify cancer at an early stage, when the cancer can be treated more easily and is less likely to have spread to other areas of the body. The prostate-specific antigen (PSA) test is the recommended screening test for prostate cancer, but it has associated risks. Discuss the risks and benefits of prostate cancer screening with your health care provider. If you are age 79 or older, the risks that screening can cause are greater than the benefits that it may provide. This information is not intended to replace advice given to you by your health care provider. Make sure you discuss any questions you have with your health care provider. Document Revised: 02/09/2021 Document Reviewed: 02/09/2021 Elsevier Patient Education  2024 [employer].

## 2024-03-15 NOTE — Progress Notes (Signed)
   03/15/24 9:42 AM   Bradley Holland 21-May-1946 [id number]  CC: Penile rash/phimosis, PSA screening  HPI: 78 year old male who reports at least a few months of penile irritation and rash.  Has been tried with antifungal creams with only mild improvement.  He also had a number of questions about PSA screening today, most recent PSA was normal at 1.1 from 2022 with PCP.  His brother has prostate cancer.   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 Gaylyn   COLONOSCOPY WITH PROPOFOL  N/A 07/09/2015   Procedure: COLONOSCOPY WITH PROPOFOL ;  Surgeon: Reyes LELON Cota, MD;  Location: [facility] ENDOSCOPY;  Service: Endoscopy;  Laterality: N/A;   COLONOSCOPY WITH PROPOFOL  N/A 09/05/2020   Procedure: COLONOSCOPY WITH PROPOFOL ;  Surgeon: Cota Reyes LELON, MD;  Location: [facility] ENDOSCOPY;  Service: Endoscopy;  Laterality: N/A;   NECK SURGERY     PALATE SURGERY      Family History: No family history on file.  Social History:  reports that he has never smoked. He has never used smokeless tobacco. He reports that he does not drink alcohol  and does not use drugs.  Physical Exam: BP (!) 146/68 (BP Location: Left Arm, Patient Position: Sitting, Cuff Size: Normal)   Pulse 67   Ht 5' 7 (1.702 m)   Wt 175 lb (79.4 kg)   SpO2 94%   BMI 27.41 kg/m    Constitutional:  Alert and oriented, No acute distress. Cardiovascular: No clubbing, cyanosis, or edema. Respiratory: Normal respiratory  effort, no increased work of breathing. GI: Abdomen is soft, nontender, nondistended, no abdominal masses GU: Uncircumcised phallus with patent meatus, very mild tightness of the foreskin but easily able to retract to visualize glans and replace, fair amount of nystatin cream, very mild erythema  Assessment & Plan:   78 year old male with mild phimosis and possible subtle balanitis.  I recommended a trial of Lotrisone  cream.  We discussed other alternatives like circumcision in the future if persistent bothersome symptoms.  In terms of PSA screening, reassurance was provided regarding his normal PSA from 2022, and we reviewed the AUA guidelines that do not recommend routine screening in men over age 98  Trial of Lotrisone  cream RTC 8 weeks symptom check  Redell Burnet, MD 03/15/2024  [facility] [address] [place], [place] [address] [phone]

## 2024-04-03 DIAGNOSIS — M51369 Other intervertebral disc degeneration, lumbar region without mention of lumbar back pain or lower extremity pain: Secondary | ICD-10-CM | POA: Diagnosis not present

## 2024-04-03 DIAGNOSIS — M4316 Spondylolisthesis, lumbar region: Secondary | ICD-10-CM | POA: Diagnosis not present

## 2024-04-03 DIAGNOSIS — Z6829 Body mass index (BMI) 29.0-29.9, adult: Secondary | ICD-10-CM | POA: Diagnosis not present

## 2024-04-13 DIAGNOSIS — E538 Deficiency of other specified B group vitamins: Secondary | ICD-10-CM | POA: Diagnosis not present

## 2024-05-09 ENCOUNTER — Ambulatory Visit: Admitting: Urology

## 2024-05-09 VITALS — BP 127/74 | HR 80 | Wt 177.0 lb

## 2024-05-09 DIAGNOSIS — N471 Phimosis: Secondary | ICD-10-CM | POA: Diagnosis not present

## 2024-05-09 DIAGNOSIS — Z125 Encounter for screening for malignant neoplasm of prostate: Secondary | ICD-10-CM | POA: Diagnosis not present

## 2024-05-09 MED ORDER — CLOTRIMAZOLE-BETAMETHASONE 1-0.05 % EX CREA: 1.0000 | TOPICAL_CREAM | Freq: Two times a day (BID) | CUTANEOUS | 1 refills | Status: DC

## 2024-05-09 MED ORDER — CLOTRIMAZOLE-BETAMETHASONE 1-0.05 % EX CREA: 1.0000 | TOPICAL_CREAM | Freq: Two times a day (BID) | CUTANEOUS | 1 refills | Status: AC

## 2024-05-09 NOTE — Progress Notes (Signed)
   05/09/2024 11:21 AM   Bradley Holland August 16, 1946 [id number]  Reason for visit: Follow up phimosis/balanitis, PSA screening  History: Originally seen July 2025 with a few months of penile irritation and rash, ultimately was treated with Lotrisone  cream PSA normal at 1.1 in 2022, brother with prostate cancer  Physical Exam: BP 127/74 (BP Location: Left Arm, Patient Position: Sitting, Cuff Size: Normal)   Pulse 80   Wt 177 lb (80.3 kg)   SpO2 95%   BMI 27.72 kg/m  Uncircumcised phallus with patent meatus, no evidence of erythema or phimosis/balanitis  Today: Symptoms resolved after Lotrisone  Bradley Holland, no complaints today He would like a refill in case his symptoms recur  Plan:   Follow-up with urology as needed, can use Lotrisone  again as needed if needed No further PSA screening recommended based on the guideline recommendations that do not recommend screening in men over age 56, especially with his normal PSA in 2022 at age 25   Bradley JAYSON Burnet, MD  [facility] [address] [place], [place] [address] [phone]

## 2024-05-14 DIAGNOSIS — E538 Deficiency of other specified B group vitamins: Secondary | ICD-10-CM | POA: Diagnosis not present

## 2024-06-06 DIAGNOSIS — H2513 Age-related nuclear cataract, bilateral: Secondary | ICD-10-CM | POA: Diagnosis not present

## 2024-06-14 DIAGNOSIS — E538 Deficiency of other specified B group vitamins: Secondary | ICD-10-CM | POA: Diagnosis not present

## 2024-07-05 DIAGNOSIS — M65332 Trigger finger, left middle finger: Secondary | ICD-10-CM | POA: Diagnosis not present

## 2024-07-05 DIAGNOSIS — M65331 Trigger finger, right middle finger: Secondary | ICD-10-CM | POA: Diagnosis not present

## 2024-07-05 DIAGNOSIS — M720 Palmar fascial fibromatosis [Dupuytren]: Secondary | ICD-10-CM | POA: Diagnosis not present

## 2024-07-16 DIAGNOSIS — E538 Deficiency of other specified B group vitamins: Secondary | ICD-10-CM | POA: Diagnosis not present
